# Patient Record
Sex: Male | Born: 1940 | Race: White | Hispanic: No | Marital: Married | Smoking: Never smoker
Health system: Southern US, Community
[De-identification: ages and names within clinical notes are randomized; demographics above are authoritative.]

## PROBLEM LIST (undated history)

## (undated) DIAGNOSIS — I872 Venous insufficiency (chronic) (peripheral): Secondary | ICD-10-CM

## (undated) DIAGNOSIS — I493 Ventricular premature depolarization: Secondary | ICD-10-CM

## (undated) DIAGNOSIS — G4733 Obstructive sleep apnea (adult) (pediatric): Secondary | ICD-10-CM

## (undated) DIAGNOSIS — I459 Conduction disorder, unspecified: Secondary | ICD-10-CM

## (undated) DIAGNOSIS — I10 Essential (primary) hypertension: Secondary | ICD-10-CM

## (undated) HISTORY — DX: Obstructive sleep apnea (adult) (pediatric): G47.33

## (undated) HISTORY — DX: Conduction disorder, unspecified: I45.9

## (undated) HISTORY — DX: Ventricular premature depolarization: I49.3

## (undated) HISTORY — DX: Venous insufficiency (chronic) (peripheral): I87.2

## (undated) HISTORY — DX: Essential (primary) hypertension: I10

---

## 2003-07-20 ENCOUNTER — Ambulatory Visit (HOSPITAL_COMMUNITY): Admission: RE | Admit: 2003-07-20 | Discharge: 2003-07-20 | Payer: Self-pay | Admitting: Gastroenterology

## 2003-07-20 ENCOUNTER — Encounter (INDEPENDENT_AMBULATORY_CARE_PROVIDER_SITE_OTHER): Payer: Self-pay | Admitting: Specialist

## 2004-09-08 ENCOUNTER — Encounter: Admission: RE | Admit: 2004-09-08 | Discharge: 2004-09-08 | Payer: Self-pay

## 2004-09-12 ENCOUNTER — Ambulatory Visit (HOSPITAL_COMMUNITY): Admission: RE | Admit: 2004-09-12 | Discharge: 2004-09-12 | Payer: Self-pay

## 2004-09-12 ENCOUNTER — Ambulatory Visit (HOSPITAL_BASED_OUTPATIENT_CLINIC_OR_DEPARTMENT_OTHER): Admission: RE | Admit: 2004-09-12 | Discharge: 2004-09-12 | Payer: Self-pay

## 2006-06-10 ENCOUNTER — Ambulatory Visit: Payer: Self-pay | Admitting: Internal Medicine

## 2006-06-24 ENCOUNTER — Ambulatory Visit: Payer: Self-pay

## 2006-06-24 ENCOUNTER — Ambulatory Visit: Payer: Self-pay | Admitting: Internal Medicine

## 2006-06-24 ENCOUNTER — Encounter: Payer: Self-pay | Admitting: Cardiology

## 2007-04-12 ENCOUNTER — Ambulatory Visit (HOSPITAL_BASED_OUTPATIENT_CLINIC_OR_DEPARTMENT_OTHER): Admission: RE | Admit: 2007-04-12 | Discharge: 2007-04-12 | Payer: Self-pay | Admitting: Orthopedic Surgery

## 2010-07-01 NOTE — Op Note (Signed)
Ralph Goodman, Ralph Goodman             ACCOUNT NO.:  [id number]   MEDICAL RECORD NO.:  [id number]          PATIENT TYPE:  AMB   LOCATION:  [facility]                         FACILITY:  [facility]   PHYSICIAN:  Deidre Ala, M.D.    DATE OF BIRTH:  18-May-1940   DATE OF PROCEDURE:  04/12/2007  DATE OF DISCHARGE:                               OPERATIVE REPORT   PREOPERATIVE DIAGNOSES:  1. 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  2. Second hammertoe with dorsal callosity and marked fixed proximal      interphalangeal joint flexion deformity.  3. Third mallet toe with varus configuration and fixed distal      deformity.   POSTOPERATIVE DIAGNOSES:  1. 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  2. Second hammertoe with dorsal callosity and marked fixed proximal      interphalangeal joint flexion deformity.  3. Third mallet toe with varus configuration and fixed distal      deformity.   PROCEDURES:  1. Left foot modified McBride bunionectomy with abductor release.  2. Left hammertoe correction with resection of PIP joint and DuVries      correction.  3. Left third mallet toe correction with distal DuVries cuneiform      shape, valgus producing.   SURGEON:  1. Charlesetta Shanks, M.D.   ASSISTANT:  Phineas Semen, P.A.C.   ANESTHESIA:  General with LMA and ankle block.   CULTURES:  None.   DRAINS:  None.   ESTIMATED BLOOD LOSS:  Minimal.   TOURNIQUET TIME:  One hour 12 minutes.   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  He has metatarsus primus  varus.  We discussed first metatarsal osteotomy at the base with  internal fixation, but he felt that that procedure was too extensive and  understood the limitations of simple bunionectomy with adductor release  but elected to go with that as well as the hammertoe and mallet toe  corrections as these were his most symptomatic problems.  Therefore at  surgery, we removed the bony exostosis of the first metatarsal,  tightening the capsule medially and straightened but not over-corrected  the great toe position to a more physiologic position and did an  adductor release.  The second toe, we did a classic DuVries wedge  resection with the apex plantar the PIP joint with resection as per the  DuVries methodology of skin, tendon, joint and release of the flexor  tendon.  This corrected with the Xeroflo bolsters and vertical mattress  suture 4-0 nylon the toe, removing the painful exostosis, the fixed  deformity and leaving the toe in a somewhat physiologically curved in  the anterior-posterior plane  position as per normal toe configuration.  The DIP, there was some question about whether not it would be needed to  be fixed, but I felt, although a bit flexed, it was not fixed and  therefore we left it.  It is slightly curled downward by overall  deformity but I think the toe configuration overall is back to  physiologic.  The third toe had more of a mallet deformity and we were  able to correct it with the same DuVries wedge component distally with a  cuneiform construction with the apex medial and plantar and therefore  corrected the Veress sitting it nicely between the second and third toes  when brought up.  There was some preexisting nail plate deformity  secondary to old nail bed injury or trauma, this was not addressed.  Bolsters were used on that toe.   PROCEDURE:  With adequate anesthesia obtained using LMA technique, 1  gram Ancef given IV prophylaxis, the patient was placed in the  supine  position.  The left lower extremity was prepped from toes to the midcalf  in the standard fashion.  After standard prepping and draping, Esmarch  examination was used, the tourniquet was let up to 350 mmHg.  I then  made a curvilinear incision over the dorsal medial aspect of the MTP  joint of the great toe.  Incision was deepened sharply with the knife  and hemostasis obtained using the Bovie electrocoagulator.  There were  numerous coursing veins that were carefully protected and retracted.  A  capsulotomy was then made with the apex based distal.  We then excised  some of the soft tissue on the exostosis and reflected the capsule back  distally.  I then placed retractors and with an oscillating saw, removed  the exostosis.  I then beveled the superior medial edge as well as took  the osteophyte off the distal MTP joint metatarsal head.  Irrigation was  carried out.  I then made an incision in the web between one and two and  dissected down to the abductor which I released with a 64 Beaver blade.  I then closed the capsule with the toe corrected, but not over-corrected  with figure-of-eight 2-0 Vicryl sutures with a good capsular soft tissue  closure effected.  These wounds were then closed with running 4-0 nylon  with interrupted vertical mattress sutures.  Attention was then turned  to the left second toe where the dorsal wedge resection of skin and the  exostosis and the colostomy was carried out on the PIP joint extensor  tendon.  The saw was used for the joint surface itself with the wedge  resection and I then released the flexor tendon.  I then used the  bolsters with a vertical mattress suture of 4-0 nylon and corrected the  angular downward deformity using the bolsters as fulcrums.  I then  sutured both sides of the wound with 4-0 nylon.  Attention was then  turned to the left third mallet toe where the exact same procedure was  performed on the DIP joint but with a  cuneiform configuration correcting  the varus to valgus as it extended the DIP joint.  Bolster was then  used.  Bulky sterile compressive forefoot dressing was applied with Ace.  The patient then, having the procedure well, was taken to the recovery  room in satisfactory condition, to be discharged per outpatient routine,  crutches weightbearing as tolerated on his heel with a wooden sole  fracture  shoe.  Told to call the office for appointment for recheck on  Friday.  Given Tylenol No. 3 for pain.           ______________________________  V. Charlesetta Shanks, M.D.     VEP/MEDQ  D:  04/12/2007  T:  04/13/2007  Job:  [id number]

## 2010-07-01 NOTE — Assessment & Plan Note (Signed)
[facility]                         ELECTROPHYSIOLOGY OFFICE NOTE   NAME:Ralph Goodman, Lemming                    MRN:          [id number]  DATE:06/10/2006                            DOB:          [birth year]    Dr. Sharol Harness is referred today by Dr. Pervis Hocking for evaluation of  peripheral edema and irregular heartbeat.   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  He has a  history of sleep apnea, which was recently diagnosed, he has a history  of obesity, which is mild, he has a history of asthma, he has a history  of hypertension and dyslipidemia.  He initially to medical attention  after spending a shift where he was up all night taking care of a  critically ill child, and noted that he had severe peripheral edema  afterwards.  His peripheral edema ultimately went away, but since then  he has had occasional episodes where again after being up on his feet  for more often than usual, he develops peripheral edema at the end of  the day, typically getting better at the end of the day.  He has also  noted that at times his pulse is irregular though he has had no recent  EKG.   PAST MEDICAL HISTORY:  1. Hypertension, which has been present for 8 to 10 years.  2. History of dyslipidemia at least since 2004; he is on Lipitor.  3. History of peripheral edema for the last week intermittently.   FAMILY HISTORY:  His father died of complications of an MI in his early  58s; his mother died in her mid 30s of complications of cerebrovascular  disease.  He has two sisters, who are otherwise well except for some  thyroid dysfunction.   MEDICATIONS:  1. Avapro, Lipitor, Nexium, Singulair, aspirin, p.r.n. Albuterol      inhalers, and Advair.  2. Amlodipine 5 a day.  3. HCTZ 25 a day.  4. Acyclovir 400 p.r.n.   SOCIAL HISTORY:  The patient denies tobacco or ethanol abuse.   He does  drink occasional alcoholic beverages, but he denies in excess.  He  drinks up to three caffeinated beverages per day.  He has no  recreational drug use.  He exercises regularly, typically walking  several hours per week.   REVIEW OF SYSTEMS:  Review of systems are as noted in the HPI.  He also  has a history of a severe coughing spell several months ago where he  actually fractured some ribs.  He has a history of wheezing occasionally  and allergic rhinitis.  The rest of his review of systems was negative.   PAST SURGICAL HISTORY:  1. Tendon repair of the left finger.  2. Hernia repair.   PHYSICAL EXAMINATION:  GENERAL:  He is a pleasant, well-appearing,  middle-aged man in no distress.  VITAL SIGNS:  The blood pressure was 140/85, the pulse was 76 and  regular, the respirations were 18, temperature was 98.  HEENT:  Normocephalic and atraumatic, pupils were equal and  round, the  oropharynx was moist, the sclerae were anicteric.  NECK:  No jugulovenous distention, there was no thyromegaly, I did not  appreciate any bruits, the trachea was midline.  LUNGS:  Clear bilaterally to auscultation; no wheezes, rales, or  rhonchi.  CARDIOVASCULAR:  Irregular rhythm with normal S1 and a split S2, which  was physiologic.  The PMI was not enlarged nor laterally displaced.  ABDOMEN:  Obese, nontender, and nondistended, there was no organomegaly,  the bowel sounds were present, and there was no rebound or guarding.  EXTREMITIES:  No cyanosis or clubbing, trace peripheral edema  bilaterally.  Pulses were 2+ and symmetric.  NEUROLOGIC:  Alert and oriented x3 with cranial nerves intact, the  strength was 5/5 and symmetric.   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   IMPRESSION:  1. Peripheral edema.  2. Irregular heartbeat without palpitations.  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  4. Recent diagnosis of  sleep apnea undergoing evaluation for      continuous positive airway pressure.   DISCUSSION:  The patient's irregular heartbeat may well just be PACs; he  clearly has PACs on his EKG.  He could also have A-fib and for this  reason I have recommended a three-week monitor.  For his peripheral  edema and his recent diagnosis of sleep apnea, I have recommended that  he undergo three-week cardiac monitoring to rule out atrial  fibrillation, which may be occult.  For now, I have not made any changes  in his medications though with his peripheral edema we could consider  changing his Amlodipine to something else though his peripheral edema  has not bothered him particularly and so I would be reluctant to do this  on my own, although I would seek a discussion with his primary  physician, Dr. Pervis Hocking over in [place].  We will plan to see  the patient back in approximately five to six weeks.     Doylene Canning. Ladona Ridgel, MD  Electronically Signed   GWT/MedQ  DD: 06/10/2006  DT: 06/10/2006  Job #: [id number]   cc:   Graciella Belton, M.D.

## 2010-07-04 NOTE — Op Note (Signed)
NAMELASHAN, GLUTH             ACCOUNT NO.:  [id number]   MEDICAL RECORD NO.:  [id number]          PATIENT TYPE:  AMB   LOCATION:  [facility]                         FACILITY:  [facility]   PHYSICIAN:  Lorre Munroe., M.D.DATE OF BIRTH:  09/26/1940   DATE OF PROCEDURE:  09/12/2004  DATE OF DISCHARGE:                                 OPERATIVE REPORT   PREOPERATIVE DIAGNOSIS:  Umbilical hernia.   POSTOPERATIVE DIAGNOSIS:  Umbilical hernia.   OPERATION:  Repair of umbilical hernia.   SURGEON:  Lebron Conners, M.D.   ANESTHESIA:  General and local.   COMPLICATIONS:  None.   BLOOD LOSS:  Minimal.   SPECIMENS:  None.   DESCRIPTION OF PROCEDURE:  After the patient was monitored and anesthetized  and had routine preparation and draping of the abdomen,  I infiltrated local  anesthetic around the umbilicus where the umbilical hernia was present and  easily palpable. I made about a 3 cm transverse downwardly curved incision  just above the umbilicus and dissected down through the fat separating the  hernia from the surrounding normal subcutaneous tissues. The hernia was very  tightly adherent to the umbilical skin. I used the cautery and the scalpel  to dissect it off the umbilical skin. In so doing, I made a small hole in  the umbilical skin and repaired that with intracuticular 4-0 Vicryl suture.  I dissected the hernia sac down to the fascia and loosened its connections  at the fascia and reduced it beneath. It appeared to contain only  preperitoneal fat. I then dissected the preperitoneal fat away from the  umbilical hernia defect for several centimeters on each side. After getting  good hemostasis, I placed a small patch of polypropylene mesh beneath the  fascia and spread it out in the plane just beneath the fascia. I mobilized  the soft tissues off the fascia superficially for a short distance in all  directions and then repaired the hernia defect with five sutures of 2-0  Prolene  suture attaching the mesh to the sutures to provide a stable repair.  No other hernia defects were found. I then  tacked the umbilical skin down to the midline with 3-0 Vicryl and closed the  subcutaneous space somewhat with 3-0 Vicryl to prevent seroma. I closed the  skin with running intracuticular 4-0 Vicryl and Steri-Strips and applied a  compressive bandage.       WB/MEDQ  D:  09/12/2004  T:  09/12/2004  Job:  [id number]   cc:   Jillene Bucks, M.D.  Dept. of Medicine, [facility]  [address]  [place], [place] [address]

## 2010-07-04 NOTE — Op Note (Signed)
NAMEDONEL, OSOWSKI                         ACCOUNT NO.:  [id number]   MEDICAL RECORD NO.:  [id number]                   PATIENT TYPE:  AMB   LOCATION:  ENDO                                 FACILITY:  [facility]   PHYSICIAN:  Bernette Redbird, M.D.                DATE OF BIRTH:  [birth year]   DATE OF PROCEDURE:  07/20/2003  DATE OF DISCHARGE:                                 OPERATIVE REPORT   PROCEDURE:  Colonoscopy with biopsy.   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 He comes in now for colonoscopic  surveillance.   FINDINGS:  Two diminutive polyps removed.   DESCRIPTION OF PROCEDURE:  The risk of the procedure had been reviewed with  the patient who provided written consent.  Sedation was fentanyl 75 mcg and  Versed 7 mg IV without arrhythmias or desaturation. Digital exam of the  prostate was normal.  The Olympus adult video colonoscope was extremely  easily advanced to the cecum and into a normal appearing terminal ileum and  pullback was then performed. There was a fair amount of stool film in the  proximal ascending colon and cecum, but this could be washed off quite  effectively. We irrigated about 1500 mL to achieve this.  It is felt that  essentially all areas were well seen and that no significant lesions would  have been missed on this exam.   There were two small sessile polyps, each measuring about 2-4 mm across,  removed by several cold biopsies each. The first was just above the cecum,  the other at about 30 cm. No large polyps, cancer, colitis, vascular  malformations or diverticulosis were observed and retroflexion in the rectum  and resinspection of the rectum were unremarkable.   The patient tolerated the procedure well and there were no apparent  complications.   IMPRESSION:  Small sessile polyps removed as described above (211.3).   PLAN:  Await pathology results with anticipated colonoscopic surveillance in  3-5 years depending on the current histologic findings.                                               Bernette Redbird, M.D.    RB/MEDQ  D:  07/20/2003  T:  07/20/2003  Job:  [id number]   cc:   Dr. Leta Baptist  Divsion of General Internal Medicine  [facility]  [place], [place]

## 2010-11-07 LAB — I-STAT 8, (EC8 V) (CONVERTED LAB)
Acid-Base Excess: 4 — ABNORMAL HIGH
Chloride: 101
Glucose, Bld: 102 — ABNORMAL HIGH
Operator id: 114531
Potassium: 3.8
Sodium: 136
TCO2: 30
pH, Ven: 7.434 — ABNORMAL HIGH

## 2011-07-23 ENCOUNTER — Other Ambulatory Visit: Payer: Self-pay | Admitting: Gastroenterology

## 2011-12-10 ENCOUNTER — Other Ambulatory Visit: Payer: Self-pay | Admitting: Dermatology

## 2014-11-15 ENCOUNTER — Other Ambulatory Visit (HOSPITAL_COMMUNITY): Payer: Self-pay | Admitting: Orthopedic Surgery

## 2014-11-15 DIAGNOSIS — M25552 Pain in left hip: Secondary | ICD-10-CM

## 2014-11-16 ENCOUNTER — Other Ambulatory Visit (HOSPITAL_COMMUNITY): Payer: Self-pay | Admitting: Orthopedic Surgery

## 2014-11-16 DIAGNOSIS — M25552 Pain in left hip: Secondary | ICD-10-CM

## 2014-11-16 DIAGNOSIS — M545 Low back pain: Secondary | ICD-10-CM

## 2014-11-23 ENCOUNTER — Other Ambulatory Visit (HOSPITAL_COMMUNITY): Payer: Self-pay | Admitting: Orthopedic Surgery

## 2014-11-23 ENCOUNTER — Ambulatory Visit (HOSPITAL_COMMUNITY)
Admission: RE | Admit: 2014-11-23 | Discharge: 2014-11-23 | Disposition: A | Discharge status: Home / Self Care | Payer: Medicare Other | Source: Ambulatory Visit | Attending: Orthopedic Surgery | Admitting: Orthopedic Surgery

## 2014-11-23 DIAGNOSIS — M8938 Hypertrophy of bone, other site: Secondary | ICD-10-CM | POA: Insufficient documentation

## 2014-11-23 DIAGNOSIS — M545 Low back pain: Secondary | ICD-10-CM | POA: Diagnosis present

## 2014-11-23 DIAGNOSIS — M549 Dorsalgia, unspecified: Secondary | ICD-10-CM

## 2014-11-23 DIAGNOSIS — M5137 Other intervertebral disc degeneration, lumbosacral region: Secondary | ICD-10-CM | POA: Diagnosis not present

## 2014-11-23 DIAGNOSIS — M25552 Pain in left hip: Secondary | ICD-10-CM

## 2014-12-17 ENCOUNTER — Other Ambulatory Visit: Payer: Self-pay | Admitting: Gastroenterology

## 2016-06-05 ENCOUNTER — Telehealth: Payer: Self-pay | Admitting: Internal Medicine

## 2016-06-05 DIAGNOSIS — G4733 Obstructive sleep apnea (adult) (pediatric): Secondary | ICD-10-CM

## 2016-06-05 NOTE — Telephone Encounter (Signed)
Spoke with pt and he is aware of order being placed for the new replacement cpap machine.

## 2016-06-05 NOTE — Telephone Encounter (Signed)
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 He found responsivness from [facility] to be slow. He has been compliant user of CPAP 13, nasal pillows. Machine is now old and needs replacement. Original NPSG 04/18/06- AHI 30.9/ hr at [facility]  desat to 79%, body weight 225 lbs There has been no break in therapy.   Plan- order new DME, replacement for old CPAP machine, changing to auto 10-15, mask of choice, humidifier, supplies, AirView  Dx OSA.

## 2016-07-01 ENCOUNTER — Telehealth: Payer: Self-pay | Admitting: Internal Medicine

## 2016-07-01 NOTE — Telephone Encounter (Signed)
KW please advise when pt can be worked in. Thanks. 

## 2016-07-01 NOTE — Telephone Encounter (Signed)
Pt calling to check status of cpap machine/supplies ordered for him on 06/05/2016.  Per referral notes: General 06/25/2016 3:32 PM Darius BumpBowne, Theresa C - -  Note   Pt will need an appt before this can be done we have sent it back to Katie to make an appt with [facility]. I will cancel this order till they are seen and make a new one.         CY please advise where patient can be worked in to schedule- we cannot order supplies for pt until he establishes care with our clinic.  Thanks!

## 2016-07-01 NOTE — Telephone Encounter (Signed)
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 Katie- please expedite a work-in visit.

## 2016-07-03 NOTE — Telephone Encounter (Signed)
Called and spoke with pt and he stated that he will be out of town at that time.  He will need katie to call him directly so he can schedule his appt.

## 2016-07-03 NOTE — Telephone Encounter (Signed)
Please have patient come in on 07-14-16 at 11:30am (okay to double book ) for consult. This is the soonest appt we can get for him. Thanks.

## 2016-07-06 NOTE — Telephone Encounter (Signed)
Spoke with Dr. Fransico MichaelSimmons-he will be here tomorrow Tuesday 07/07/16 at 11:30am. Nothing more needed at this time.

## 2016-07-07 ENCOUNTER — Ambulatory Visit (INDEPENDENT_AMBULATORY_CARE_PROVIDER_SITE_OTHER): Payer: Medicare Other | Admitting: Internal Medicine

## 2016-07-07 ENCOUNTER — Encounter: Payer: Self-pay | Admitting: Internal Medicine

## 2016-07-07 VITALS — BP 118/72 | HR 71 | Ht 71.0 in | Wt 260.0 lb

## 2016-07-07 DIAGNOSIS — I4510 Unspecified right bundle-branch block: Secondary | ICD-10-CM | POA: Insufficient documentation

## 2016-07-07 DIAGNOSIS — J449 Chronic obstructive pulmonary disease, unspecified: Secondary | ICD-10-CM

## 2016-07-07 DIAGNOSIS — G4733 Obstructive sleep apnea (adult) (pediatric): Secondary | ICD-10-CM | POA: Insufficient documentation

## 2016-07-07 MED ORDER — FLUTICASONE-UMECLIDIN-VILANT 100-62.5-25 MCG/INH IN AEPB: 1.0000 | INHALATION_SPRAY | Freq: Every day | RESPIRATORY_TRACT | 0 refills | Status: DC

## 2016-07-07 NOTE — Assessment & Plan Note (Signed)
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 [facility]

## 2016-07-07 NOTE — Patient Instructions (Signed)
Order- New DME ([facility]), replacement old CPAP auto 10-15, mask of choice, humidifier, supplies, airView   Dx OSA  Sample Trelegy Ellipta inhaler      Try inhaling 1 puff then rinse mouth, once daily   Try this instead of Advair. If you like it maybe your doctor would be willing to let you stick with it. Otherwise, just go back to Advair. Ok to continue your rescue inhaler as before.  Please call if we can help

## 2016-07-07 NOTE — Assessment & Plan Note (Signed)
He has been a compliant long-term user of CPAP but just needs help getting established with a local DME to replace old machine and supplies. We will take the opportunity to change him to the self adjusting machine-auto 10-15

## 2016-07-07 NOTE — Assessment & Plan Note (Signed)
He has gained about 12 pounds recently and understands this is important. His health and quality of life will significantly improve if he can sustain weight loss.

## 2016-07-07 NOTE — Progress Notes (Signed)
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 [facility]. He has been compliant user of CPAP 13, nasal pillows. Machine is now old and needs replacement. Original NPSG 04/18/06- [facility]/ [facility]  desat to 79%, body weight 225 lbs There has been no break in therapy. Medical problems include HBP, knee pain, Right bundle branch block, peripheral edema, asthma, seasonal allergic rhinitis History of childhood asthma, now mainly associated with cold weather, viral infections. Prednisone very helpful if needed. [facility] He is aware of stable right lower lobe atelectasis with air trapping after a pneumonia. He sleeps very much better with CPAP, which prevents loud snoring and witnessed apneas. He wants to get established with a local DME company. Currently CPAP 13 with nasal pillows mask. He denies limb movement sleep disturbance or other parasomnias.  Prior to Admission medications   Medication Sig Start Date End Date Taking? Authorizing Provider  aspirin EC 81 MG tablet Take 81 mg by mouth daily. 05/01/11  Yes [provider]  eplerenone (INSPRA) 25 MG tablet Take 25 mg by mouth 2 (two) times daily. 12/04/15  Yes [provider]  montelukast (SINGULAIR) 10 MG tablet Take 10 mg by mouth daily. 08/26/13  Yes [provider]  pantoprazole (PROTONIX) 40 MG tablet Take 40 mg by mouth 2 (two) times daily. 04/14/16  Yes [provider]  acyclovir (ZOVIRAX) 400 MG tablet Take 400 mg by mouth daily. 05/21/16   [provider]  ADVAIR DISKUS 250-50 MCG/DOSE AEPB Inhale 1 puff into the lungs 2 (two) times daily. 05/22/16   [provider]  amLODipine (NORVASC) 5 MG tablet Take 5 mg by mouth daily. 04/20/16   [provider]  atorvastatin (LIPITOR) 10 MG tablet  Take 10 mg by mouth daily. 05/17/16   [provider]  celecoxib (CELEBREX) 100 MG capsule Take 100 mg by mouth 2 (two) times daily. 05/28/16   [provider]  Cholecalciferol (VITAMIN D-1000 MAX ST) 1000 units tablet Take 25 mg by mouth daily.    [provider]  Fluticasone-Umeclidin-Vilant (TRELEGY ELLIPTA) 100-62.5-25 MCG/INH AEPB Inhale 1 puff into the lungs daily. 07/07/16   Jetty DuhamelYoung, Vilma Will D, MD  irbesartan (AVAPRO) 150 MG tablet Take 1 tablet by mouth 2 (two) times daily. 05/27/16   [provider]   No past medical history on file. No past surgical history on file. No family history on file. Social History   Social History  . Marital status: Married    Spouse name: N/A  . Number of children: N/A  . Years of education: N/A   Occupational History  . Not on file.   Social History Main Topics  . Smoking status: Not on file  . Smokeless tobacco: Not on file  . Alcohol use Not on file  . Drug use: Unknown  . Sexual activity: Not on file   Other Topics Concern  . Not on file   Social History Narrative  . No narrative on file   ROS-see HPI   Negative unless "+" Constitutional:    weight loss, night sweats, fevers, chills, fatigue, lassitude. HEENT:    headaches, difficulty swallowing, tooth/dental problems, sore throat,       sneezing, itching, ear ache, nasal congestion, post nasal drip, snoring CV:    chest pain, orthopnea, PND, swelling  in lower extremities, anasarca,                                                         dizziness, + palpitations Resp:   + shortness of breath with exertion or at rest.                + productive cough,   non-productive cough, coughing up of blood.              change in color of mucus.  wheezing.   Skin:    rash or lesions. GI:  No-   heartburn, indigestion, abdominal pain, nausea, vomiting, diarrhea,                 change in bowel habits, loss of appetite GU: dysuria, change in color of urine, no  urgency or frequency.   flank pain. MS:   joint pain, stiffness, decreased range of motion, back pain. Neuro-     nothing unusual Psych:  change in mood or affect.  depression or anxiety.   memory loss.  OBJ- Physical Exam General- Alert, Oriented, Affect-appropriate, Distress- none acute, + morbidly obese Skin- rash-none, lesions- none, excoriation- none Lymphadenopathy- none Head- atraumatic            Eyes- Gross vision intact, PERRLA, conjunctivae and secretions clear            Ears- Hearing, canals-normal            Nose- Clear, no-Septal dev, mucus, polyps, erosion, perforation             Throat- Mallampati IV , mucosa clear , drainage- none, tonsils- atrophic Neck- flexible , trachea midline, no stridor , thyroid nl, carotid no bruit Chest - symmetrical excursion , unlabored           Heart/CV- RRR , no murmur , no gallop  , no rub, nl s1 s2                           - JVD- none , edema- none, stasis changes- none, varices- none           Lung- clear to P&A, wheeze- none, cough- none , dullness-none, rub- none           Chest wall-  Abd-  Br/ Gen/ Rectal- Not done, not indicated Extrem- cyanosis- none, clubbing, none, atrophy- none, strength- nl Neuro- grossly intact to observation

## 2016-07-07 NOTE — Assessment & Plan Note (Signed)
Moderate persistent. He is usually adequately controlled but admits frequent breakthrough cough. I suggested we try samples of Trelegy Ellipta since I have them available. If he likes this better than his current regimen, he will discuss with his PCP at upcoming visit.

## 2016-10-08 ENCOUNTER — Ambulatory Visit (INDEPENDENT_AMBULATORY_CARE_PROVIDER_SITE_OTHER): Payer: Medicare Other | Admitting: Internal Medicine

## 2016-10-08 ENCOUNTER — Encounter: Payer: Self-pay | Admitting: Internal Medicine

## 2016-10-08 DIAGNOSIS — J449 Chronic obstructive pulmonary disease, unspecified: Secondary | ICD-10-CM

## 2016-10-08 DIAGNOSIS — Z23 Encounter for immunization: Secondary | ICD-10-CM | POA: Diagnosis not present

## 2016-10-08 DIAGNOSIS — G4733 Obstructive sleep apnea (adult) (pediatric): Secondary | ICD-10-CM

## 2016-10-08 NOTE — Progress Notes (Signed)
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 He found responsivness from [facility] to be slow. He has been compliant user of CPAP 13, nasal pillows. Machine is now old and needs replacement. Original NPSG 04/18/06- AHI 30.9/ hr at [facility]  desat to 79%, body weight 225 lbs There has been no break in therapy. Medical problems include HBP, knee pain, Right bundle branch block, peripheral edema, asthma, seasonal allergic rhinitis History of childhood asthma, now mainly associated with cold weather, viral infections. Prednisone very helpful if needed. This problem is managed at [facility]. He is aware of stable right lower lobe atelectasis with air trapping after a pneumonia. He sleeps very much better with CPAP, which prevents loud snoring and witnessed apneas. He wants to get established with a local DME company. Currently CPAP 13 with nasal pillows mask. He denies limb movement sleep disturbance or other parasomnias.  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 Pt wears CPAP nightly for about 7 hours and pressure of 10-15 works well. No new supplies needed at this time. DL attached.  He is "happy" with CPAP control now and feels he benefits. Describes excellent compliance and control. Tried Trelegy inhaler without seeing any benefit so he resumed Advair.  Uses rescue inhaler infrequently. Not wheezing at night. Seasonal fall cough, mostly dry. Not wheezing and no significant rhinitis.  ROS-see HPI   Negative unless "+" Constitutional:    weight loss, night sweats, fevers, chills, fatigue, lassitude. HEENT:    headaches, difficulty swallowing, tooth/dental problems, sore throat,       sneezing,  itching, ear ache, nasal congestion, post nasal drip, snoring CV:    chest pain, orthopnea, PND, swelling in lower extremities, anasarca,                                                         dizziness, + palpitations Resp:   + shortness of breath with exertion or at rest.                 productive cough,  + non-productive cough, coughing up of blood.              change in color of mucus.  wheezing.   Skin:    rash or lesions. GI:  No-   heartburn, indigestion, abdominal pain, nausea, vomiting, diarrhea,                 change in bowel habits, loss of appetite GU: dysuria, change in color of urine, no urgency or frequency.   flank pain. MS:   joint pain, stiffness, decreased range of motion, back pain. Neuro-     nothing unusual Psych:  change in mood or affect.  depression or anxiety.   memory loss.  OBJ- Physical Exam General- Alert, Oriented, Affect-appropriate, Distress- none acute, + morbidly obese Skin- rash-none, lesions- none, excoriation- none Lymphadenopathy- none Head- atraumatic            Eyes- Gross vision intact, PERRLA, conjunctivae and secretions clear  Ears- Hearing, canals-normal            Nose- Clear, no-Septal dev, mucus, polyps, erosion, perforation             Throat- Mallampati IV , mucosa clear , drainage- none, tonsils- atrophic Neck- flexible , trachea midline, no stridor , thyroid nl, carotid no bruit Chest - symmetrical excursion , unlabored           Heart/CV- RRR , no murmur , no gallop  , no rub, nl s1 s2                           - JVD- none , edema- none, stasis changes- none, varices- none           Lung- clear to P&A, wheeze- none, cough- none , dullness-none, rub- none           Chest wall-  Abd-  Br/ Gen/ Rectal- Not done, not indicated Extrem- cyanosis- none, clubbing, none, atrophy- none, strength- nl Neuro- grossly intact to observation

## 2016-10-08 NOTE — Patient Instructions (Signed)
We can continue CPAP auto 10-15, mask of choice, humidifier,  supplies, AirView  Fine to continue Advair and Proair  Please call if we can help  Flu vax

## 2016-10-21 NOTE — Assessment & Plan Note (Signed)
Trelegy was no advantage. Uncomplicated currently. Plan-continue Advair and Pro air

## 2016-10-21 NOTE — Assessment & Plan Note (Signed)
AutoPap 10-15/APS remains appropriate. He is pleased with his current status and emphatic that he feels it benefits him. Plan-no changes required.

## 2017-03-03 IMAGING — MR MR LUMBAR SPINE W/O CM
4 of 5 series · 19 of 48 positions shown · non-contrast
Comparison: None.

CLINICAL DATA: Low back pain radiating into the left leg to the
level of the knee since [DATE].

EXAM:
MRI LUMBAR SPINE WITHOUT CONTRAST
TECHNIQUE: Multiplanar, multisequence MR imaging of the lumbar spine was
performed. No intravenous contrast was administered.

[Series 3: T2 · sagittal · 4.5mm · 0.55mm/px · 6 of 16 slices shown (1 of 2)]
[im 1/16]
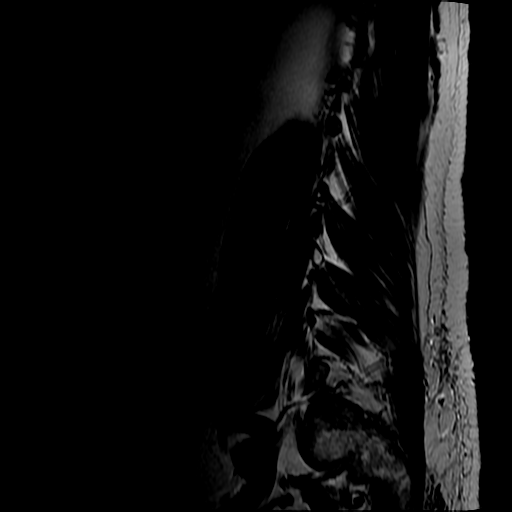
[im 4/16]
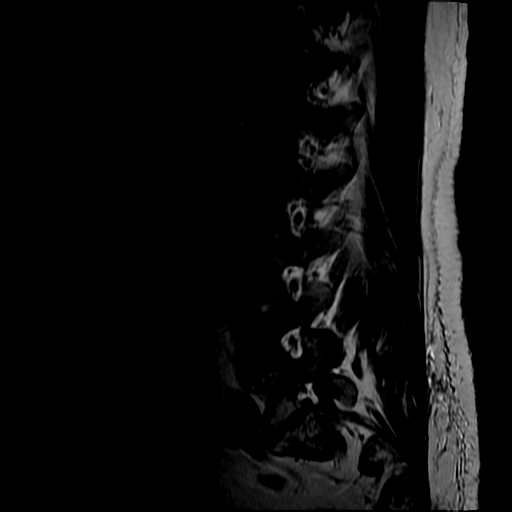
[im 7/16]
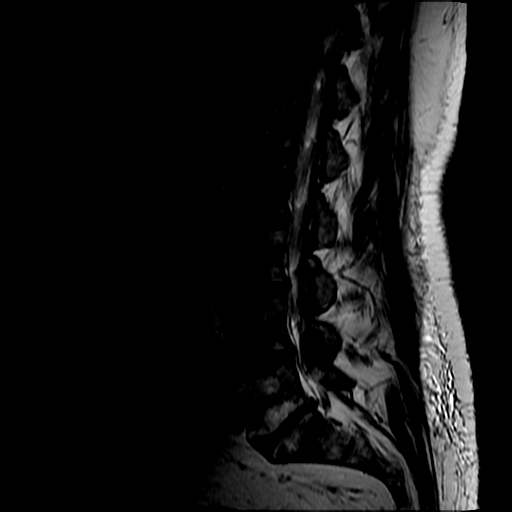
[im 10/16]
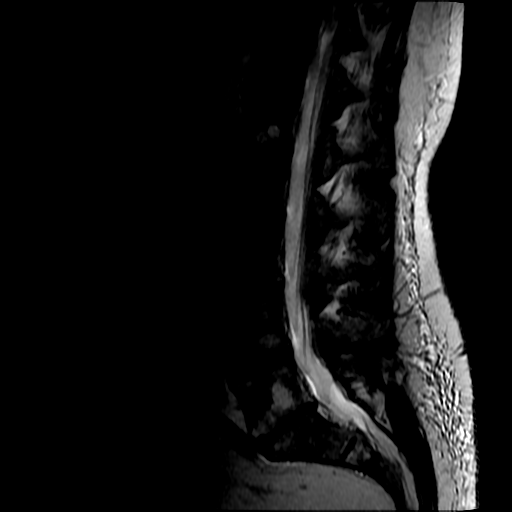
[im 13/16]
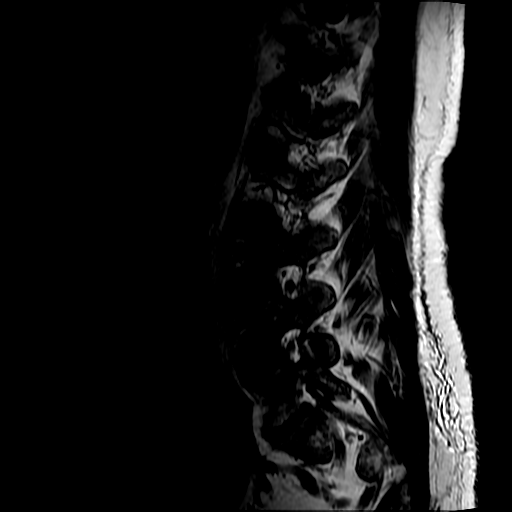
[im 16/16]
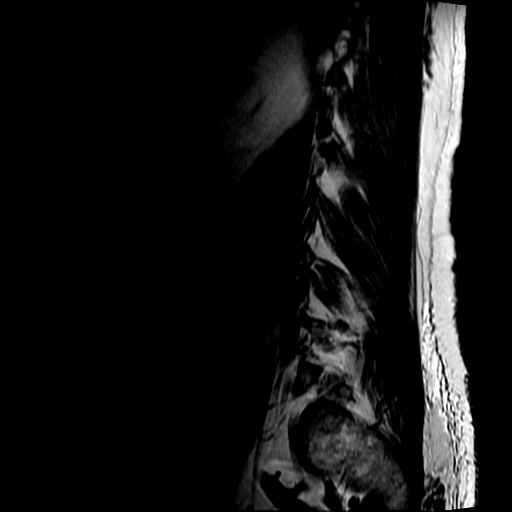

[Series 5: T1 · sagittal · 4.5mm · 0.55mm/px · 3 of 16 slices shown (1 of 2)]
[im 4/16]
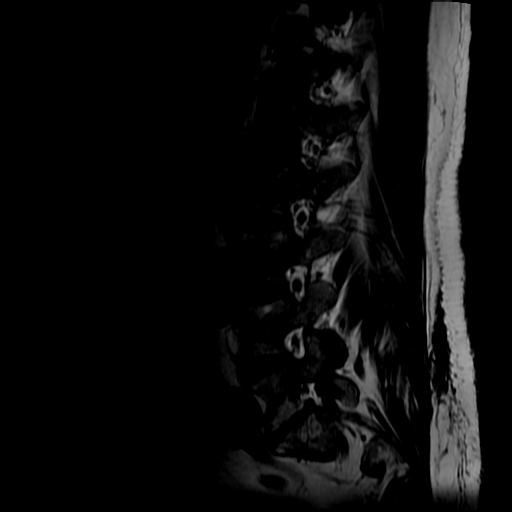
[im 10/16]
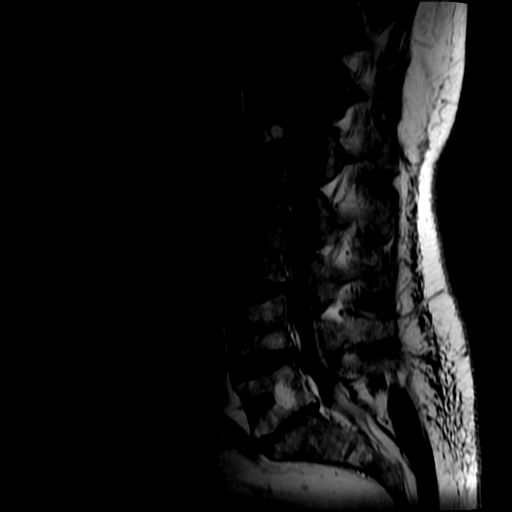
[im 16/16]
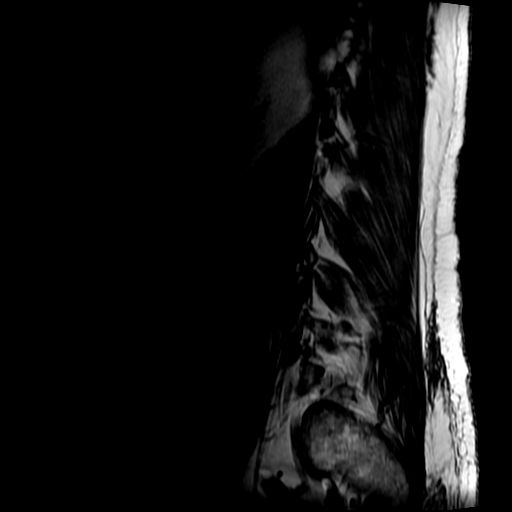

[Series 6: T2 · axial · 4.0mm · 0.39mm/px · z∈[-13,+146]mm · 7 of 37 slices shown (2 of 2)]
[im 1/37]
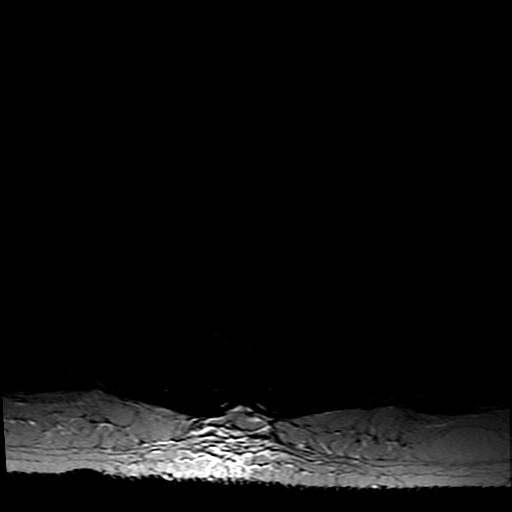
[im 6/37]
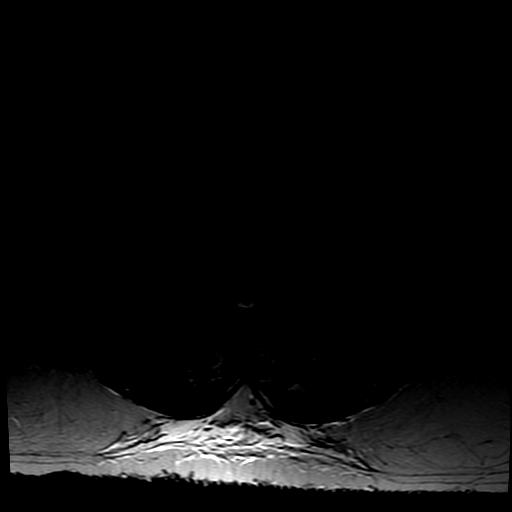
[im 11/37]
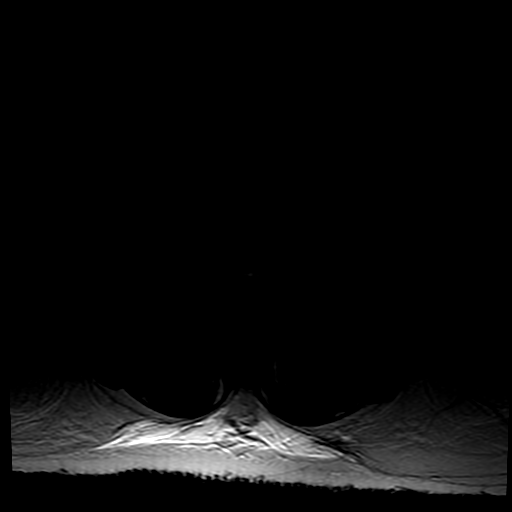
[im 16/37]
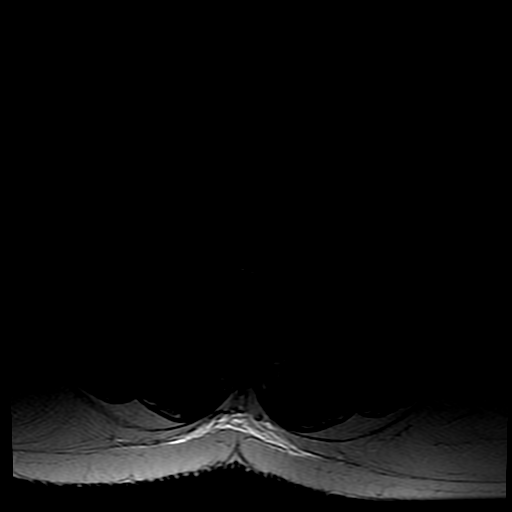
[im 19/37]
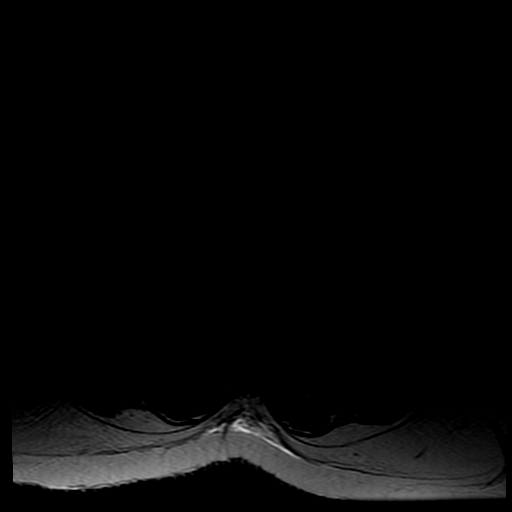
[im 21/37]
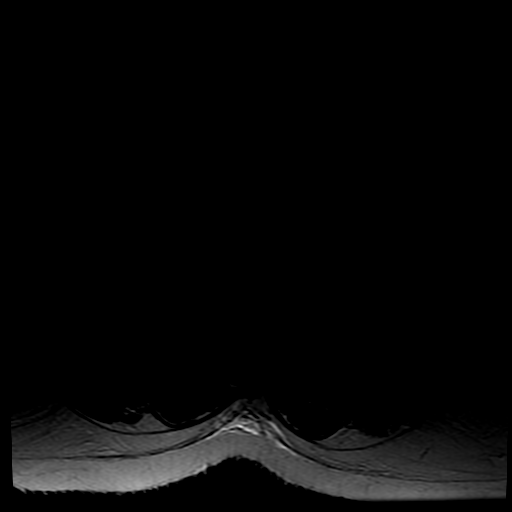
[im 31/37]
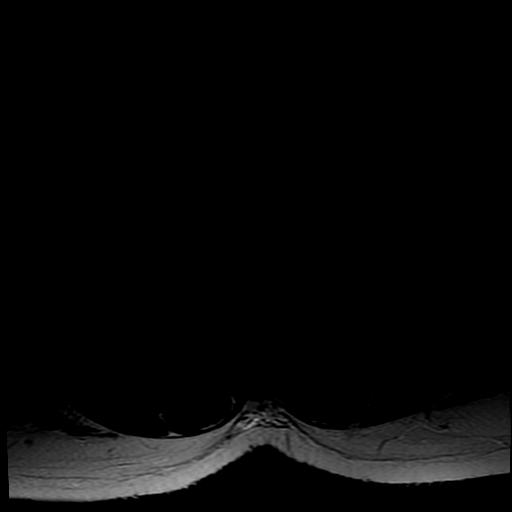

[Series 8: T1 · axial · 4.0mm · 0.39mm/px · z∈[+12,+146]mm · 3 of 37 slices shown (2 of 2)]
[im 6/37]
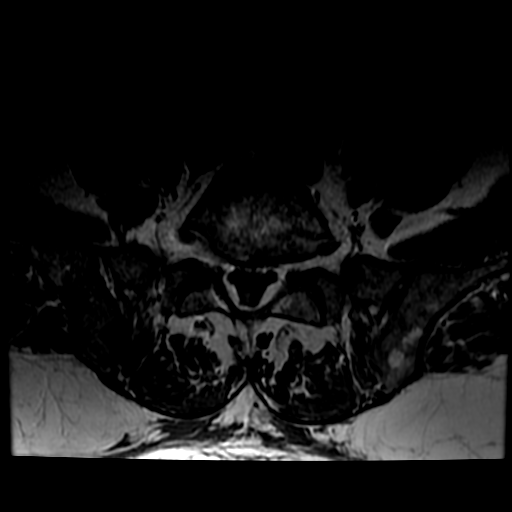
[im 19/37]
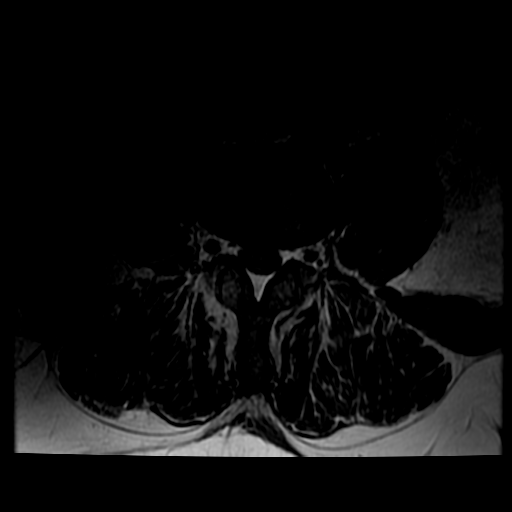
[im 31/37]
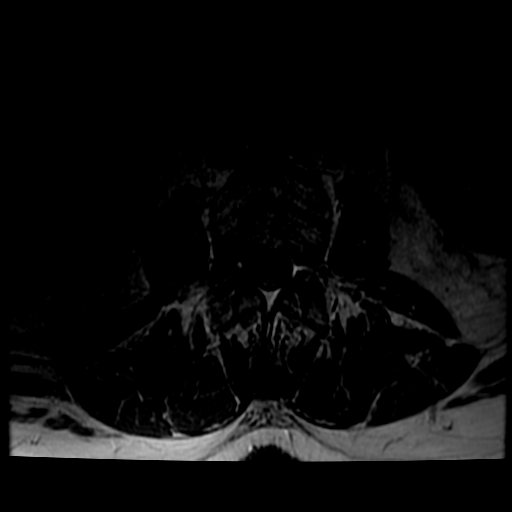

[19 of 48 positions shown; findings below may reference images not displayed]

FINDINGS: Mild motion artifact, mainly on sagittal STIR and axial T1 series.

For the purposes of this dictation, the lowest well-formed
intervertebral disc space is presumed to be L5-S1.

There is trace retrolisthesis of L5 on S1. Vertebral body heights
are preserved. Disc desiccation and moderate to severe disc space
narrowing are present at L5-S1 with evidence of vacuum disc
phenomenon. Moderate type 2 degenerative endplate changes are
present at this level. Intervertebral disc space heights are
preserved elsewhere in the lumbar spine with mild disc desiccation
and mild type 2 degenerative endplate changes noted. No vertebral
marrow edema is seen. Conus medullaris is normal in signal and
terminates at L1-2. Paraspinal soft tissues are unremarkable.

L1-2 and L2-3:  Negative.

L3-4:  Mild facet hypertrophy without disc herniation or stenosis.

L4-5:  Mild facet hypertrophy without disc herniation or stenosis.

L5-S1: Mild disc bulging, endplate spurring, and mild facet
hypertrophy without stenosis.
IMPRESSION: 1. Single level disc degeneration at L5-S1 without stenosis.
2. Mild multilevel facet hypertrophy.

## 2017-04-21 ENCOUNTER — Telehealth: Payer: Self-pay | Admitting: Internal Medicine

## 2017-04-21 NOTE — Telephone Encounter (Signed)
Spoke with the pt  He states since the time of his call to us he was able to contact [employer] for his supplies  They are sending him the supplies and told him that order from us is not necc  Nothing needed per pt.

## 2017-09-14 ENCOUNTER — Telehealth: Payer: Self-pay

## 2017-09-14 NOTE — Telephone Encounter (Signed)
called pt to reschedule upcoming apt for 10/08/17 with CY. First available with CY is not until October. pt stated per medicare, compliance report is needed in August.  I have offered apt with NP and pt declined.   CY please advise. Thanks

## 2017-09-15 ENCOUNTER — Encounter: Payer: Self-pay | Admitting: Internal Medicine

## 2017-09-15 NOTE — Telephone Encounter (Signed)
LMTCB-pt can be seen Friday 09-17-17 at 9:30 or 10:30am. Thanks.

## 2017-09-15 NOTE — Telephone Encounter (Signed)
Katie- please squeeze him in somewhere

## 2017-09-16 NOTE — Telephone Encounter (Signed)
Pt returned phone call. Pt sched to see CY on 8/2 at 9:30.

## 2017-09-17 ENCOUNTER — Encounter: Payer: Self-pay | Admitting: Internal Medicine

## 2017-09-17 ENCOUNTER — Ambulatory Visit: Payer: Medicare Other | Admitting: Internal Medicine

## 2017-09-17 VITALS — BP 130/70 | HR 79 | Ht 70.0 in | Wt 269.6 lb

## 2017-09-17 DIAGNOSIS — G4733 Obstructive sleep apnea (adult) (pediatric): Secondary | ICD-10-CM

## 2017-09-17 DIAGNOSIS — J449 Chronic obstructive pulmonary disease, unspecified: Secondary | ICD-10-CM | POA: Diagnosis not present

## 2017-09-17 MED ORDER — ALBUTEROL SULFATE HFA 108 (90 BASE) MCG/ACT IN AERS: INHALATION_SPRAY | RESPIRATORY_TRACT | 12 refills | Status: DC

## 2017-09-17 NOTE — Assessment & Plan Note (Signed)
He definitely benefits from CPAP with better sleep and is very comfortable with current settings.  Download shows good compliance and control. Plan-continue CPAP auto 10-15

## 2017-09-17 NOTE — Assessment & Plan Note (Signed)
Mild persistent uncomplicated.  Exacerbations mostly if he catches a cold but he did notice increased pine pollen this spring.  Usually well controlled using Advair once daily and albuterol rescue inhaler once daily.

## 2017-09-17 NOTE — Progress Notes (Signed)
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 Pt wears CPAP nightly for about 7 hours and pressure of 10-15 works well. No new supplies needed at this time. DL attached.  He is "happy" with CPAP control now and feels he benefits. Describes excellent compliance and control. Tried Trelegy inhaler without seeing any benefit so he resumed Advair.  Uses rescue inhaler infrequently. Not wheezing at night. Seasonal fall cough, mostly dry. Not wheezing and no significant rhinitis.  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 Pt continues to wear CPAP nightly and DL attached. No new supplies needed at this time.  Advair 250, Singulair, ProAir hfa Doing very well with CPAP.  No complaints.  Download 100% compliance AHI 0.4/hour. Heavy pollen this spring did bother him some.  Currently he is using Advair once daily, preferring over samples we have given.  Rescue inhaler about once daily as routine.  He feels this is controlled.  ROS-see HPI   + = positive Constitutional:    weight loss, night sweats, fevers, chills, fatigue, lassitude. HEENT:    headaches, difficulty swallowing, tooth/dental problems, sore throat,       sneezing, itching, ear ache, nasal congestion, post nasal drip, snoring CV:    chest pain, orthopnea, PND, swelling in lower  extremities, anasarca,                                                         dizziness, + palpitations Resp:   + shortness of breath with exertion or at rest.                 productive cough,  + non-productive cough, coughing up of blood.              change in color of mucus.  wheezing.   Skin:    rash or lesions. GI:  No-   heartburn, indigestion, abdominal pain, nausea, vomiting, diarrhea,                 change in bowel habits, loss of appetite GU: dysuria, change in color of urine, no urgency or frequency.   flank pain. MS:   joint pain, stiffness, decreased range of motion, back pain. Neuro-     nothing unusual Psych:  change in mood or affect.  depression or anxiety.   memory loss.  OBJ- Physical Exam General- Alert, Oriented, Affect-appropriate, Distress- none acute, + morbidly obese Skin- rash-none, lesions- none, excoriation- none Lymphadenopathy- none Head- atraumatic            Eyes- Gross vision intact, PERRLA, conjunctivae and secretions clear            Ears- Hearing, canals-normal            Nose- Clear, no-Septal dev, mucus, polyps, erosion, perforation  Throat- Mallampati IV , mucosa clear , drainage- none, tonsils- atrophic Neck- flexible , trachea midline, no stridor , thyroid nl, carotid no bruit Chest - symmetrical excursion , unlabored           Heart/CV- RRR , no murmur , no gallop  , no rub, nl s1 s2                           - JVD- none , edema- none, stasis changes- none, varices- none           Lung- clear to P&A, wheeze- none, cough- none , dullness-none, rub- none           Chest wall-  Abd-  Br/ Gen/ Rectal- Not done, not indicated Extrem- cyanosis- none, clubbing, none, atrophy- none, strength- nl Neuro- grossly intact to observation

## 2017-09-17 NOTE — Patient Instructions (Signed)
We can continue CPAP auto 10-15, mask of choice, humidifier, supplies, AirView  Please call if we can help 

## 2017-10-08 ENCOUNTER — Ambulatory Visit: Payer: Medicare Other | Admitting: Internal Medicine

## 2017-11-12 ENCOUNTER — Ambulatory Visit (INDEPENDENT_AMBULATORY_CARE_PROVIDER_SITE_OTHER): Payer: Medicare Other

## 2017-11-12 DIAGNOSIS — Z23 Encounter for immunization: Secondary | ICD-10-CM

## 2018-09-19 ENCOUNTER — Ambulatory Visit: Payer: Medicare Other | Admitting: Internal Medicine

## 2018-09-19 ENCOUNTER — Encounter: Payer: Self-pay | Admitting: Internal Medicine

## 2018-09-19 ENCOUNTER — Other Ambulatory Visit: Payer: Self-pay

## 2018-09-19 VITALS — BP 124/60 | HR 79 | Temp 97.9°F | Wt 270.2 lb

## 2018-09-19 DIAGNOSIS — R0609 Other forms of dyspnea: Secondary | ICD-10-CM | POA: Diagnosis not present

## 2018-09-19 DIAGNOSIS — G4733 Obstructive sleep apnea (adult) (pediatric): Secondary | ICD-10-CM | POA: Diagnosis not present

## 2018-09-19 DIAGNOSIS — J449 Chronic obstructive pulmonary disease, unspecified: Secondary | ICD-10-CM | POA: Diagnosis not present

## 2018-09-19 NOTE — Progress Notes (Signed)
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 Pt continues to wear CPAP nightly and DL attached. No new supplies needed at this time.  Advair 250, Singulair, ProAir hfa Doing very well with CPAP.  No complaints.  Download 100% compliance AHI 0.4/hour. Heavy pollen this spring did bother him some.  Currently he is using Advair once daily, preferring over samples we have given.  Rescue inhaler about once daily as routine.  He feels this is controlled.  09/19/2018- 78 year old male, retired Optometristediatrician,  never smoker,  followed for OSA,  complicated by HBP, RBBB, peripheral edema, asthma, seasonal allergic rhinitis, morbid obesity CPAP auto 10-15/APS Download compliance 100%, AHI 0.4/ hr Body weight today 270 lbs -----OSA on CPAP auto 10-15, DME: [facility]; no complaints Feels much better off with CPAP. No concerns. Does note easier DOE. Occ wheeze with exertion in cold.  Using rescue inhaler each AM and occ at night before bed. Continues Wixela 250.  No angina.   ROS-see HPI   + = positive Constitutional:    weight loss, night sweats, fevers, chills, fatigue, lassitude. HEENT:    headaches, difficulty swallowing, tooth/dental problems, sore throat,       sneezing, itching, ear ache, nasal congestion, post nasal drip, snoring CV:    chest pain, orthopnea, PND, swelling in lower extremities, anasarca,                                                         dizziness, + palpitations Resp:   + shortness of breath with  exertion or at rest.                 productive cough,  + non-productive cough, coughing up of blood.              change in color of mucus.  wheezing.   Skin:    rash or lesions. GI:  No-   heartburn, indigestion, abdominal pain, nausea, vomiting, diarrhea,                 change in bowel habits, loss of appetite GU: dysuria, change in color of urine, no urgency or frequency.   flank pain. MS:   joint pain, stiffness, decreased range of motion, back pain. Neuro-     nothing unusual Psych:  change in mood or affect.  depression or anxiety.   memory loss.  OBJ- Physical Exam General- Alert, Oriented, Affect-appropriate, Distress- none acute, + morbidly obese Skin- rash-none, lesions- none, excoriation- none Lymphadenopathy- none Head- atraumatic            Eyes- Gross vision intact, PERRLA, conjunctivae and secretions clear            Ears- Hearing, canals-normal            Nose- Clear, no-Septal dev, mucus, polyps, erosion, perforation             Throat- Mallampati IV , mucosa clear , drainage-  none, tonsils- atrophic Neck- flexible , trachea midline, no stridor , thyroid nl, carotid no bruit Chest - symmetrical excursion , unlabored           Heart/CV- RRR , no murmur , no gallop  , no rub, nl s1 s2                           - JVD- none , edema+, stasis changes+, varices- none           Lung- clear to P&A, wheeze- none, cough- none , dullness-none, rub- none           Chest wall-  Abd-  Br/ Gen/ Rectal- Not done, not indicated Extrem- cyanosis- none, clubbing, none, atrophy- none, strength- nl Neuro- grossly intact to observation

## 2018-09-19 NOTE — Patient Instructions (Signed)
We can continue CPAP auto 10-15, mask of choice, humidifier, supplies, AirView/ card  Order- schedule PFT    Dx dyspnea on exertion

## 2018-11-12 NOTE — Assessment & Plan Note (Signed)
Contnues to benefit from CPAP, confirmed by [facility]. Plan- continue autopap 10-15

## 2018-11-12 NOTE — Assessment & Plan Note (Signed)
More aware of DOE, in addition to baseline of cold and exertion associated wheeze. Obviously obesity, deconditioning are factors. Cardiac status followed by Cardiology with know RBBB. Plan- PFT

## 2018-11-12 NOTE — Assessment & Plan Note (Signed)
Needs encouragement and support toward normalizing body weight he put on after retirement.

## 2018-11-25 ENCOUNTER — Telehealth: Payer: Self-pay | Admitting: Internal Medicine

## 2018-11-28 NOTE — Telephone Encounter (Signed)
Printed & placed in folder for COVID test to be sched for 03/08/2019 PFT.

## 2019-05-05 DIAGNOSIS — J449 Chronic obstructive pulmonary disease, unspecified: Secondary | ICD-10-CM

## 2019-05-05 NOTE — Telephone Encounter (Signed)
Dr. Maple Hudson please advise on time frame for patient to have chest x-ray, PFT and OV with you.  Patient email:  Ralph Goodman -- I saw [name] today for a long delayed by COVID PCP visit.   I told him you had ordered PFTs, and that I had deferred them until I was vaccinated.   He listened to my recent history of more SOB and chronic cough, with modest sputum production.  He said he wanted to get in touch with you, and to have me make an appointment with you after I get PFTs.  He heard lots of wheezing today, and recommends we pursue pulmonary causes of SOB before diving deeper into cardiac.  He did get an ECG, which is unchanged, and ordered a Pro-BNP.    He did not get a chest film, leaving that up to you.  Can you advise how to get PFTs re-scheduled, and then obtain an appointment with you?  Thanks,   [name]

## 2019-05-05 NOTE — Telephone Encounter (Signed)
Please order CXR and PFT next available   Dx Asthmatic Bronchitis  Go ahead with CXR. Get CXR when we can.

## 2019-05-08 ENCOUNTER — Other Ambulatory Visit: Payer: Self-pay

## 2019-05-08 MED ORDER — TRELEGY ELLIPTA 100-62.5-25 MCG/INH IN AEPB: 1.0000 | INHALATION_SPRAY | Freq: Every day | RESPIRATORY_TRACT | 11 refills | Status: DC

## 2019-05-08 NOTE — Addendum Note (Signed)
Addended by: Sheran Luz on: 05/08/2019 12:44 PM   Modules accepted: Orders

## 2019-05-08 NOTE — Addendum Note (Signed)
Addended by: Sheran Luz on: 05/08/2019 05:46 PM   Modules accepted: Orders

## 2019-05-08 NOTE — Telephone Encounter (Signed)
Suggest Rx Trelegy 100, # 1, ref x 12,   Inhale 1 puff, then rinse mouth once daily.

## 2019-05-08 NOTE — Telephone Encounter (Signed)
Pt called and states he is only on the Advair currently but states he feels he needs an anticholinergic. Pt is requesting either to be put back on Trelegy or added another inhaler.   Dr. Maple Hudson please advise.   No Known Allergies  Current Outpatient Medications on File Prior to Visit  Medication Sig Dispense Refill  . acyclovir (ZOVIRAX) 400 MG tablet Take 400 mg by mouth daily.  12  . ADVAIR DISKUS 250-50 MCG/DOSE AEPB Inhale 1 puff into the lungs 2 (two) times daily.  3  . albuterol (PROAIR HFA) 108 (90 Base) MCG/ACT inhaler Inhale 2 puffs every 6 hours as needed- rescue 1 Inhaler 12  . amLODipine (NORVASC) 5 MG tablet Take 5 mg by mouth daily.  12  . atorvastatin (LIPITOR) 10 MG tablet Take 10 mg by mouth daily.  3  . celecoxib (CELEBREX) 100 MG capsule Take 100 mg by mouth 2 (two) times daily.  11  . eplerenone (INSPRA) 25 MG tablet Take 25 mg by mouth 2 (two) times daily.    . irbesartan (AVAPRO) 150 MG tablet Take 1 tablet by mouth 2 (two) times daily.  3  . montelukast (SINGULAIR) 10 MG tablet Take 10 mg by mouth daily.    . pantoprazole (PROTONIX) 40 MG tablet Take 40 mg by mouth 2 (two) times daily.     No current facility-administered medications on file prior to visit.

## 2019-05-10 ENCOUNTER — Ambulatory Visit (INDEPENDENT_AMBULATORY_CARE_PROVIDER_SITE_OTHER): Payer: Medicare PPO

## 2019-05-10 DIAGNOSIS — J449 Chronic obstructive pulmonary disease, unspecified: Secondary | ICD-10-CM | POA: Diagnosis not present

## 2019-05-10 NOTE — Telephone Encounter (Signed)
Email sent by patient today 05/10/19:  Ralph Goodman -- I got my chest film today, and began Trelegy this AM.                  My PFTs are not scheduled until June 15.   Ralph Goodman could get them scheduled earlier at [facility].  Let me know if you want me to do that.  I would like to see you after the PFTs.  Thanks,   Ralph Goodman  Dr. Maple Hudson please advise  No Known Allergies Current Outpatient Medications on File Prior to Visit  Medication Sig Dispense Refill  . acyclovir (ZOVIRAX) 400 MG tablet Take 400 mg by mouth daily.  12  . ADVAIR DISKUS 250-50 MCG/DOSE AEPB Inhale 1 puff into the lungs 2 (two) times daily.  3  . albuterol (PROAIR HFA) 108 (90 Base) MCG/ACT inhaler Inhale 2 puffs every 6 hours as needed- rescue 1 Inhaler 12  . amLODipine (NORVASC) 5 MG tablet Take 5 mg by mouth daily.  12  . atorvastatin (LIPITOR) 10 MG tablet Take 10 mg by mouth daily.  3  . celecoxib (CELEBREX) 100 MG capsule Take 100 mg by mouth 2 (two) times daily.  11  . eplerenone (INSPRA) 25 MG tablet Take 25 mg by mouth 2 (two) times daily.    . Fluticasone-Umeclidin-Vilant (TRELEGY ELLIPTA) 100-62.5-25 MCG/INH AEPB Inhale 1 puff into the lungs daily. 28 each 11  . irbesartan (AVAPRO) 150 MG tablet Take 1 tablet by mouth 2 (two) times daily.  3  . montelukast (SINGULAIR) 10 MG tablet Take 10 mg by mouth daily.    . pantoprazole (PROTONIX) 40 MG tablet Take 40 mg by mouth 2 (two) times daily.     No current facility-administered medications on file prior to visit.

## 2019-05-10 NOTE — Telephone Encounter (Signed)
We are opening up our PFT schedule. I will ask my folks to see if they can get it done earlier than originally scheduled and get you in to see me that day, or soon after. Otherwise, it is absolutely fine to get the PFT done at [facility].

## 2019-05-11 NOTE — Progress Notes (Signed)
Patient identification verified. Recent chest x ray results provided. Per Dr. Maple Hudson, chest x ray is stable with scarring again noted, no change or new process seen. Patient verbalized understanding of results.

## 2019-05-11 NOTE — Telephone Encounter (Signed)
Dr. Maple Hudson, This message came for you this afternoon.  [name] -- Thanks for reviewing the chest film so quickly.  I loved that their comparison film was from 2006.  I have had many films and two CTs since then!  So much for the seamless power of EPIC.                 Should I try to get an earlier appt for PFTs at [facility]?  June 15 seems like a long delay.                              This is day two of Trelegy.  I will let you know if I think there is any difference in the next month.  Thanks,   [employer] routed to Dr. Maple Hudson

## 2019-05-11 NOTE — Telephone Encounter (Signed)
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 If still uncomfortably long delay for hi, then please let him know he can go ahead with scheduling at [facility]. Whatever works best for him please.

## 2019-05-22 ENCOUNTER — Telehealth: Payer: Self-pay

## 2019-05-22 NOTE — Telephone Encounter (Signed)
I spoke with patient's regarding  PFT . Was able to move patient's PFT to April 22. Also scheduled covis screening . Patient's voice was understanding . Nothing else further needed.

## 2019-05-22 NOTE — Telephone Encounter (Signed)
Left a voicemail on patient's phone to call our office back to schedule a PFT. Patient does have a pft for June we have opened up spots for more PFT's.

## 2019-06-05 ENCOUNTER — Other Ambulatory Visit (HOSPITAL_COMMUNITY)
Admission: RE | Admit: 2019-06-05 | Discharge: 2019-06-05 | Disposition: A | Discharge status: Home / Self Care | Payer: Medicare PPO | Source: Ambulatory Visit | Attending: Internal Medicine | Admitting: Internal Medicine

## 2019-06-05 DIAGNOSIS — Z20822 Contact with and (suspected) exposure to covid-19: Secondary | ICD-10-CM | POA: Diagnosis not present

## 2019-06-05 DIAGNOSIS — Z01812 Encounter for preprocedural laboratory examination: Secondary | ICD-10-CM | POA: Diagnosis present

## 2019-06-05 LAB — SARS CORONAVIRUS 2 (TAT 6-24 HRS): SARS Coronavirus 2: NEGATIVE

## 2019-06-08 ENCOUNTER — Ambulatory Visit (INDEPENDENT_AMBULATORY_CARE_PROVIDER_SITE_OTHER): Payer: Medicare PPO

## 2019-06-08 ENCOUNTER — Ambulatory Visit (INDEPENDENT_AMBULATORY_CARE_PROVIDER_SITE_OTHER): Payer: Medicare PPO | Admitting: Internal Medicine

## 2019-06-08 ENCOUNTER — Other Ambulatory Visit: Payer: Self-pay

## 2019-06-08 ENCOUNTER — Telehealth: Payer: Self-pay | Admitting: Internal Medicine

## 2019-06-08 DIAGNOSIS — R0600 Dyspnea, unspecified: Secondary | ICD-10-CM | POA: Diagnosis not present

## 2019-06-08 DIAGNOSIS — J449 Chronic obstructive pulmonary disease, unspecified: Secondary | ICD-10-CM

## 2019-06-08 DIAGNOSIS — R0609 Other forms of dyspnea: Secondary | ICD-10-CM

## 2019-06-08 LAB — PULMONARY FUNCTION TEST
DL/VA % pred: 150 %
DL/VA: 5.85 ml/min/mmHg/L
DLCO cor % pred: 102 %
DLCO cor: 25.2 ml/min/mmHg
DLCO unc % pred: 102 %
DLCO unc: 25.2 ml/min/mmHg
FEF 25-75 Post: 0.98 L/sec
FEF 25-75 Pre: 0.8 L/sec
FEF2575-%Change-Post: 22 %
FEF2575-%Pred-Post: 48 %
FEF2575-%Pred-Pre: 39 %
FEV1-%Change-Post: 6 %
FEV1-%Pred-Post: 54 %
FEV1-%Pred-Pre: 51 %
FEV1-Post: 1.59 L
FEV1-Pre: 1.49 L
FEV1FVC-%Change-Post: 1 %
FEV1FVC-%Pred-Pre: 91 %
FEV6-%Change-Post: 8 %
FEV6-%Pred-Post: 61 %
FEV6-%Pred-Pre: 57 %
FEV6-Post: 2.35 L
FEV6-Pre: 2.18 L
FEV6FVC-%Change-Post: 0 %
FEV6FVC-%Pred-Post: 105 %
FEV6FVC-%Pred-Pre: 106 %
FVC-%Change-Post: 5 %
FVC-%Pred-Post: 58 %
FVC-%Pred-Pre: 55 %
FVC-Post: 2.38 L
FVC-Pre: 2.26 L
Post FEV1/FVC ratio: 67 %
Post FEV6/FVC ratio: 99 %
Pre FEV1/FVC ratio: 66 %
Pre FEV6/FVC Ratio: 99 %

## 2019-06-08 NOTE — Telephone Encounter (Signed)
Yes please order CXR   dx Asthmatic bronchitis moderate persistent

## 2019-06-08 NOTE — Telephone Encounter (Signed)
Spoke with patient. He is coming in for a PFT this afternoon. He wants to have a CXR while he is here. He stated that he was told to have one done today but there is no order in his chart.   Dr. Maple Hudson, please advise if you are ok with ordering a CXR for him. Looks like he last one done back in March. Thank you!

## 2019-06-08 NOTE — Progress Notes (Signed)
PFT done today. 

## 2019-06-08 NOTE — Telephone Encounter (Signed)
Received the following message from patient:   "[name] -- I had my PFTs done this afternoon, and got another chest film.                 I do not do well with PFTs.   The tech was fabulous, and remarkably patient with me .......... and properly persistent.   I hope you got an interpretable study.   If not, it is your patient, not the technology nor the tech.                Trelegy -- Assessing improvement is hard.  This pollen season always affects me.    I think before the pine pollen hit, I was having less cough, but still about the same DOE.                 Should we chat, or should I make an appointment?  Thanks,   Ralph Goodman"  Dr. Maple Hudson, please advise. Thanks!

## 2019-06-08 NOTE — Telephone Encounter (Signed)
Left detailed message for patient. Order has been placed. Will close this encounter.

## 2019-06-08 NOTE — Telephone Encounter (Signed)
Left message for patient to call back  

## 2019-06-09 MED ORDER — BREZTRI AEROSPHERE 160-9-4.8 MCG/ACT IN AERO: 2.0000 | INHALATION_SPRAY | Freq: Two times a day (BID) | RESPIRATORY_TRACT | 0 refills | Status: DC

## 2019-06-09 NOTE — Telephone Encounter (Signed)
Per our phone discussion this afternoon, there is a mixed obstructive and restrictive pattern on PFTs, with preserved oxygen exchange( i.e Diffussion Capacity). Your primary physician may be able to compare this result to earlier PFTs in the [facility] system. Your chest xray shows old scarring, but nothing that looks like a new or progressive lung problem.  I will ask my staff to leave up front for you: 1) A copy of the interpretation of your PFT  2) 2 samples of Breztri inhaler to try instead of Trelegy    Inhale 2 puffs, then rinse mouth, twice daily

## 2019-06-09 NOTE — Addendum Note (Signed)
Addended by: Wyvonne Lenz on: 06/09/2019 03:27 PM   Modules accepted: Orders

## 2019-06-21 MED ORDER — BREZTRI AEROSPHERE 160-9-4.8 MCG/ACT IN AERO: 2.0000 | INHALATION_SPRAY | Freq: Two times a day (BID) | RESPIRATORY_TRACT | 6 refills | Status: DC

## 2019-07-29 ENCOUNTER — Other Ambulatory Visit (HOSPITAL_COMMUNITY): Payer: Medicare Other

## 2019-10-04 ENCOUNTER — Other Ambulatory Visit: Payer: Self-pay

## 2019-10-04 ENCOUNTER — Encounter: Payer: Self-pay | Admitting: Internal Medicine

## 2019-10-04 ENCOUNTER — Ambulatory Visit: Payer: Medicare PPO | Admitting: Internal Medicine

## 2019-10-04 VITALS — BP 136/76 | HR 65 | Temp 97.2°F | Ht 70.0 in | Wt 265.4 lb

## 2019-10-04 DIAGNOSIS — J449 Chronic obstructive pulmonary disease, unspecified: Secondary | ICD-10-CM

## 2019-10-04 DIAGNOSIS — G4733 Obstructive sleep apnea (adult) (pediatric): Secondary | ICD-10-CM

## 2019-10-04 NOTE — Patient Instructions (Signed)
Order- DME [facility] please service CPAP machine for leaks.   Continue auto 10-15, mask of choice, humidifier, supplies, Airview/card  Please call if we can help

## 2019-10-04 NOTE — Progress Notes (Signed)
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 No concerns. Does note easier DOE. Occ wheeze with exertion in cold.  Using rescue inhaler each AM and occ at night before bed. Continues Wixela 250.  No angina.   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  Doing well with CPAP, but he hears leak which may be internal to casing  ROS-see HPI   + = positive Constitutional:    weight loss, night sweats, fevers, chills, fatigue, lassitude. HEENT:    headaches, difficulty swallowing, tooth/dental problems, sore throat,       sneezing, itching, ear ache, nasal congestion, post nasal drip, snoring CV:    chest pain, orthopnea, PND, swelling in lower extremities, anasarca,                                                         dizziness, + palpitations Resp:   + shortness of breath with exertion or at rest.                 productive cough,  + non-productive cough, coughing up of blood.              change in color of mucus.  wheezing.   Skin:    rash or  lesions. GI:  No-   heartburn, indigestion, abdominal pain, nausea, vomiting, diarrhea,                 change in bowel habits, loss of appetite GU: dysuria, change in color of urine, no urgency or frequency.   flank pain. MS:   joint pain, stiffness, decreased range of motion, back pain. Neuro-     nothing unusual Psych:  change in mood or affect.  depression or anxiety.   memory loss.  OBJ- Physical Exam General- Alert, Oriented, Affect-appropriate, Distress- none acute, + morbidly obese Skin- rash-none, lesions- none, excoriation- none Lymphadenopathy- none Head- atraumatic            Eyes- Gross vision intact, PERRLA, conjunctivae and secretions clear            Ears- Hearing, canals-normal            Nose- Clear, no-Septal dev, mucus, polyps, erosion, perforation             Throat- Mallampati IV , mucosa clear , drainage- none, tonsils- atrophic Neck- flexible , trachea midline, no stridor , thyroid nl, carotid no bruit Chest - symmetrical excursion , unlabored  Heart/CV- RRR , no murmur , no gallop  , no rub, nl s1 s2                           - JVD- none , edema+, stasis changes+, varices- none           Lung- clear to P&A, wheeze- none, cough- none , dullness-none, rub- none           Chest wall-  Abd-  Br/ Gen/ Rectal- Not done, not indicated Extrem- cyanosis- none, clubbing, none, atrophy- none, strength- nl Neuro- grossly intact to observation

## 2019-10-14 NOTE — Assessment & Plan Note (Signed)
Comfortable control without exceptional discomfort on summer trip. Discussed altitude and exertion.

## 2019-10-14 NOTE — Assessment & Plan Note (Signed)
Benefits from CPAP with good compliance and control Plan- DME to service machine for possible leak. Continue auto 10-15.

## 2020-04-11 ENCOUNTER — Ambulatory Visit
Admission: RE | Admit: 2020-04-11 | Discharge: 2020-04-11 | Disposition: A | Discharge status: Home / Self Care | Payer: Medicare PPO | Source: Ambulatory Visit | Attending: Gastroenterology | Admitting: Gastroenterology

## 2020-04-11 ENCOUNTER — Other Ambulatory Visit: Payer: Self-pay | Admitting: Gastroenterology

## 2020-04-11 ENCOUNTER — Other Ambulatory Visit: Payer: Self-pay

## 2020-04-11 DIAGNOSIS — K5900 Constipation, unspecified: Secondary | ICD-10-CM

## 2020-04-11 DIAGNOSIS — R140 Abdominal distension (gaseous): Secondary | ICD-10-CM

## 2020-04-16 ENCOUNTER — Other Ambulatory Visit: Payer: Self-pay | Admitting: Gastroenterology

## 2020-04-16 ENCOUNTER — Ambulatory Visit
Admission: RE | Admit: 2020-04-16 | Discharge: 2020-04-16 | Disposition: A | Discharge status: Home / Self Care | Payer: Medicare PPO | Source: Ambulatory Visit | Attending: Gastroenterology | Admitting: Gastroenterology

## 2020-04-16 DIAGNOSIS — K5900 Constipation, unspecified: Secondary | ICD-10-CM

## 2020-04-18 ENCOUNTER — Other Ambulatory Visit: Payer: Self-pay | Admitting: Gastroenterology

## 2020-04-18 ENCOUNTER — Ambulatory Visit
Admission: RE | Admit: 2020-04-18 | Discharge: 2020-04-18 | Disposition: A | Discharge status: Home / Self Care | Payer: Medicare PPO | Source: Ambulatory Visit | Attending: Gastroenterology | Admitting: Gastroenterology

## 2020-04-18 DIAGNOSIS — K5900 Constipation, unspecified: Secondary | ICD-10-CM

## 2020-08-14 DIAGNOSIS — G4733 Obstructive sleep apnea (adult) (pediatric): Secondary | ICD-10-CM

## 2020-08-14 NOTE — Telephone Encounter (Signed)
Please order his DME change autopap range to 5- 10. Ask him to see if this helps and if not, then let me know and we will look at next steps.

## 2020-08-14 NOTE — Telephone Encounter (Signed)
Dr. Annamaria Boots, please see mychart message sent by pt and advise: Lanier Prude, MD 21 minutes ago (1:52 PM)   Ralph Goodman -- Could we chat by phone for 5 - 10 minutes?   I really need your advice.  My distension is disabling.  Upper and lower endoscopies show no causes for the distension.   A CT scan with contrast last Friday showed no mechanical obstruction.   I am on a second antibiotic course for SIBO.   My hydrogen and methane breath tests (home kit) are normal.    I am on a FODMAP elimination diet, with no effect.    We are to the point where I am thinking it might be aerophagia.   I have now read a bit more about such in CPAP usage.  A logical next step for me is getting advice from you about possible CPAP adjustments:  Newer device?    Now on nasal canulas, which I like; my reading suggest they may be better than full face re' aerophagia.   Adjusting end expiratory pressure?  ANY ideas/advice that you have ........     Could we please chat?   [name]

## 2020-08-15 NOTE — Telephone Encounter (Signed)
Dr. Maple Hudson, please see and advise on mychart message. Thanks!  THANK YOU SO MUCH.  I still am amazed at the over the air DME changes/upgrades/read outs.   Did Dr. Maple Hudson have any suggestions about sleep positions that might help?  I have read Left Side Down, but that makes little sense to me.   Dr.  Sharol Harness

## 2020-08-20 NOTE — Telephone Encounter (Signed)
Order- please ask his DME to change autopap to 8-12. If this isn't comfortable I would like to set up either in-person or televisit with Dr Sharol Harness to see what we can do with his CPAP/

## 2020-08-20 NOTE — Addendum Note (Signed)
Addended by: Sandra Cockayne on: 08/20/2020 03:37 PM   Modules accepted: Orders

## 2020-08-20 NOTE — Telephone Encounter (Signed)
Hello Dr. Maple Hudson, please advise on mychart message below, thanks!:  Ralph Goodman  -- The new pressure settings on my Air Sense 10 were changed late Friday, 7/1.   Ralph Goodman at [place] was very helpful.   I have not done well over the four nights on 5cm H20.   I feel air hungry and have a higher baseline respiratory rate.  I talked to [facility] this AM and she cannot make re-adjustments without an order from you.   She looked at my pressures over the last four nights, and the highest I required was 9.2 cm/H2O.  My events were not increased (0.7/hour).  But I did not sleep at all soundly so I don't know how reliable that is.   I need some help.  Could we go back up, at least to 8 cm?   Ralph Goodman is going to fax your office, knowing I would be trying to reach you.   Thanks,   [name]

## 2020-08-21 NOTE — Telephone Encounter (Signed)
Routing My Chart message to Dr. Maple Hudson as FYI  Please let Dr. Maple Hudson know that I did very well on the new lower settings.   I talked to [facility] at [facility] today, and she said the peak pressure I required was 9 cmH20.   My events were 0.1 per hour.   I have an appointment with Dr. Maple Hudson in August.   I can wait for then to discuss more fully.   Thanks,  Dr. Sharol Harness  Nothing further needed at this time.

## 2020-10-02 NOTE — Progress Notes (Signed)
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 Feels habituated and that he needs CPAP. We looked at download report and with his weight loss, discussed trying without. Decided to do HST off CPAP.  Notes  more DOE- walking less. Never able to clear peripheral edema.  ROS-see HPI   + = positive Constitutional:    +weight loss, night sweats, fevers, chills, fatigue, lassitude. HEENT:    headaches, difficulty swallowing, tooth/dental problems, sore throat,       sneezing, itching, ear ache, nasal congestion, post nasal drip, snoring CV:    chest pain, orthopnea, PND, +swelling in lower extremities, anasarca,                                                         dizziness, + palpitations Resp:   + shortness of breath with exertion or at rest.                 productive cough,  + non-productive  cough, coughing up of blood.              change in color of mucus.  wheezing.   Skin:    rash or lesions. GI:  No-   heartburn, indigestion, abdominal pain, nausea, vomiting, diarrhea,                 change in bowel habits, loss of appetite GU: dysuria, change in color of urine, no urgency or frequency.   flank pain. MS:   joint pain, stiffness, decreased range of motion, back pain. Neuro-     nothing unusual Psych:  change in mood or affect.  depression or anxiety.   memory loss.  OBJ- Physical Exam General- Alert, Oriented, Affect-appropriate, Distress- none acute, + morbidly obese Skin- rash-none, lesions- none, excoriation- none Lymphadenopathy- none Head- atraumatic            Eyes- Gross vision intact, PERRLA, conjunctivae and secretions clear            Ears- Hearing, canals-normal            Nose- Clear, no-Septal dev, mucus, polyps, erosion, perforation             Throat- Mallampati IV , mucosa clear , drainage- none, tonsils- atrophic Neck- flexible , trachea midline, no stridor , thyroid nl, carotid no bruit Chest -  symmetrical excursion , unlabored           Heart/CV- RRR , no murmur , no gallop  , no rub, nl s1 s2                           - JVD- none , edema+, stasis changes+, varices- none           Lung- + somewhat labored with conversation, more comfortable with mask off, clear to P&A, wheeze- none, cough- none , dullness-none, rub- none           Chest wall-  Abd-  Br/ Gen/ Rectal- Not done, not indicated Extrem- cyanosis- none, clubbing, none, atrophy- none, strength- nl Neuro- grossly intact to observation

## 2020-10-03 ENCOUNTER — Other Ambulatory Visit: Payer: Self-pay

## 2020-10-03 ENCOUNTER — Encounter: Payer: Self-pay | Admitting: Internal Medicine

## 2020-10-03 ENCOUNTER — Ambulatory Visit: Payer: Medicare PPO | Admitting: Internal Medicine

## 2020-10-03 VITALS — BP 120/60 | HR 61 | Temp 98.2°F | Ht 71.0 in | Wt 252.0 lb

## 2020-10-03 DIAGNOSIS — G4733 Obstructive sleep apnea (adult) (pediatric): Secondary | ICD-10-CM

## 2020-10-03 NOTE — Patient Instructions (Addendum)
Order- DME [facility]-  please change to auto 10-15, EPR 4  Order- schedule home sleep test    dx OSA  Please call if we can help

## 2020-10-04 NOTE — Telephone Encounter (Signed)
Hello Dr. Maple Hudson, please see mychart message below, thanks!  [name] -- I will wait to hear from someone about getting the Home Sleep Study equipment and instructions.   I like the idea of doing this.  We will be OOT for 2+ weeks over Labor Day.   I will expect [facility] to give me a call about the CPAP adjustments.   Thanks,   [name]

## 2020-10-07 ENCOUNTER — Encounter: Payer: Self-pay | Admitting: Internal Medicine

## 2020-10-07 NOTE — Assessment & Plan Note (Signed)
He feels benefit from CPAP. We note very good control. Plan- HST off CPAP for update. Change back to auto 10-15/ EPR 4  order his request.

## 2020-12-05 ENCOUNTER — Ambulatory Visit: Payer: Medicare PPO

## 2020-12-05 ENCOUNTER — Other Ambulatory Visit: Payer: Self-pay

## 2020-12-05 DIAGNOSIS — G4733 Obstructive sleep apnea (adult) (pediatric): Secondary | ICD-10-CM | POA: Diagnosis not present

## 2020-12-13 DIAGNOSIS — G4733 Obstructive sleep apnea (adult) (pediatric): Secondary | ICD-10-CM | POA: Diagnosis not present

## 2020-12-16 DIAGNOSIS — G4733 Obstructive sleep apnea (adult) (pediatric): Secondary | ICD-10-CM

## 2020-12-19 NOTE — Telephone Encounter (Signed)
Dr Maple Hudson, Please advise the following:  Ralph Goodman -- The two messages you recently sent had no content which I could see/open.  I would LOVE to know the results of my home sleep apnea test, but it cannot be seen on My Chart.                   Can someone call me [phone]) or email me ([email or web address]).     [name]

## 2020-12-19 NOTE — Telephone Encounter (Signed)
Hi Dr Maple Hudson thanks for addressing the question.   Dr Sharol Harness states the pressure was never changed by [facility] for 10-15cm with the EPR of 4. Patient states he would like to continue wearing the CPAP. I will contact the [facility] and try to figure out what happen. Thank you.

## 2020-12-19 NOTE — Telephone Encounter (Signed)
Please send refresher order to [facility] D[facility]       Continue CPAP auto 10-15, EPR 4, mask of choice, humidifier, supplies, AirView/ card

## 2020-12-19 NOTE — Telephone Encounter (Signed)
Home sleep test showed very mild obstructive sleep apnea with AHI of 8.1 episodes/ hour. Original score in 2008 was 30.9/ hr when recorded weight was 225 lbs. 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

## 2021-03-13 ENCOUNTER — Other Ambulatory Visit: Payer: Self-pay | Admitting: Orthopaedic Surgery

## 2021-03-13 DIAGNOSIS — M79671 Pain in right foot: Secondary | ICD-10-CM

## 2021-03-21 ENCOUNTER — Ambulatory Visit
Admission: RE | Admit: 2021-03-21 | Discharge: 2021-03-21 | Disposition: A | Discharge status: Home / Self Care | Payer: Medicare PPO | Source: Ambulatory Visit | Attending: Orthopaedic Surgery | Admitting: Orthopaedic Surgery

## 2021-03-21 DIAGNOSIS — M79671 Pain in right foot: Secondary | ICD-10-CM

## 2021-04-11 ENCOUNTER — Emergency Department (HOSPITAL_COMMUNITY): Payer: Medicare PPO

## 2021-04-11 ENCOUNTER — Observation Stay (HOSPITAL_COMMUNITY)
Admission: EM | Admit: 2021-04-11 | Discharge: 2021-04-12 | Disposition: A | Discharge status: Home / Self Care | Payer: Medicare PPO | Attending: Internal Medicine | Admitting: Internal Medicine

## 2021-04-11 DIAGNOSIS — I77819 Aortic ectasia, unspecified site: Secondary | ICD-10-CM | POA: Insufficient documentation

## 2021-04-11 DIAGNOSIS — Z7901 Long term (current) use of anticoagulants: Secondary | ICD-10-CM | POA: Diagnosis not present

## 2021-04-11 DIAGNOSIS — K219 Gastro-esophageal reflux disease without esophagitis: Secondary | ICD-10-CM | POA: Diagnosis not present

## 2021-04-11 DIAGNOSIS — E871 Hypo-osmolality and hyponatremia: Secondary | ICD-10-CM | POA: Insufficient documentation

## 2021-04-11 DIAGNOSIS — Z9989 Dependence on other enabling machines and devices: Secondary | ICD-10-CM | POA: Insufficient documentation

## 2021-04-11 DIAGNOSIS — E876 Hypokalemia: Secondary | ICD-10-CM | POA: Insufficient documentation

## 2021-04-11 DIAGNOSIS — I441 Atrioventricular block, second degree: Secondary | ICD-10-CM | POA: Insufficient documentation

## 2021-04-11 DIAGNOSIS — R002 Palpitations: Secondary | ICD-10-CM | POA: Insufficient documentation

## 2021-04-11 DIAGNOSIS — R0602 Shortness of breath: Secondary | ICD-10-CM | POA: Diagnosis not present

## 2021-04-11 DIAGNOSIS — Z20822 Contact with and (suspected) exposure to covid-19: Secondary | ICD-10-CM | POA: Insufficient documentation

## 2021-04-11 DIAGNOSIS — I119 Hypertensive heart disease without heart failure: Secondary | ICD-10-CM | POA: Insufficient documentation

## 2021-04-11 DIAGNOSIS — K589 Irritable bowel syndrome without diarrhea: Secondary | ICD-10-CM | POA: Insufficient documentation

## 2021-04-11 DIAGNOSIS — R0600 Dyspnea, unspecified: Secondary | ICD-10-CM | POA: Insufficient documentation

## 2021-04-11 DIAGNOSIS — R001 Bradycardia, unspecified: Secondary | ICD-10-CM | POA: Diagnosis not present

## 2021-04-11 DIAGNOSIS — R9431 Abnormal electrocardiogram [ECG] [EKG]: Secondary | ICD-10-CM | POA: Insufficient documentation

## 2021-04-11 DIAGNOSIS — Z79899 Other long term (current) drug therapy: Secondary | ICD-10-CM | POA: Diagnosis not present

## 2021-04-11 DIAGNOSIS — J45909 Unspecified asthma, uncomplicated: Secondary | ICD-10-CM | POA: Diagnosis not present

## 2021-04-11 DIAGNOSIS — G4733 Obstructive sleep apnea (adult) (pediatric): Secondary | ICD-10-CM | POA: Diagnosis not present

## 2021-04-11 DIAGNOSIS — D696 Thrombocytopenia, unspecified: Secondary | ICD-10-CM | POA: Insufficient documentation

## 2021-04-11 DIAGNOSIS — R197 Diarrhea, unspecified: Secondary | ICD-10-CM | POA: Insufficient documentation

## 2021-04-11 DIAGNOSIS — I452 Bifascicular block: Secondary | ICD-10-CM | POA: Insufficient documentation

## 2021-04-11 DIAGNOSIS — Z8679 Personal history of other diseases of the circulatory system: Secondary | ICD-10-CM | POA: Insufficient documentation

## 2021-04-11 DIAGNOSIS — I358 Other nonrheumatic aortic valve disorders: Secondary | ICD-10-CM | POA: Diagnosis not present

## 2021-04-11 DIAGNOSIS — R2681 Unsteadiness on feet: Secondary | ICD-10-CM | POA: Insufficient documentation

## 2021-04-11 DIAGNOSIS — R778 Other specified abnormalities of plasma proteins: Secondary | ICD-10-CM | POA: Insufficient documentation

## 2021-04-11 DIAGNOSIS — E785 Hyperlipidemia, unspecified: Secondary | ICD-10-CM | POA: Insufficient documentation

## 2021-04-11 DIAGNOSIS — K5900 Constipation, unspecified: Secondary | ICD-10-CM | POA: Insufficient documentation

## 2021-04-11 DIAGNOSIS — I872 Venous insufficiency (chronic) (peripheral): Secondary | ICD-10-CM | POA: Insufficient documentation

## 2021-04-11 DIAGNOSIS — M19071 Primary osteoarthritis, right ankle and foot: Secondary | ICD-10-CM | POA: Insufficient documentation

## 2021-04-11 DIAGNOSIS — I440 Atrioventricular block, first degree: Secondary | ICD-10-CM | POA: Insufficient documentation

## 2021-04-11 DIAGNOSIS — R2689 Other abnormalities of gait and mobility: Secondary | ICD-10-CM | POA: Insufficient documentation

## 2021-04-11 DIAGNOSIS — I10 Essential (primary) hypertension: Secondary | ICD-10-CM | POA: Insufficient documentation

## 2021-04-11 DIAGNOSIS — E878 Other disorders of electrolyte and fluid balance, not elsewhere classified: Secondary | ICD-10-CM | POA: Diagnosis not present

## 2021-04-11 LAB — COMPREHENSIVE METABOLIC PANEL
ALT: 38 U/L (ref 0–44)
AST: 36 U/L (ref 15–41)
Albumin: 3.4 g/dL — ABNORMAL LOW (ref 3.5–5.0)
Alkaline Phosphatase: 60 U/L (ref 38–126)
Anion gap: 13 (ref 5–15)
BUN: 18 mg/dL (ref 8–23)
CO2: 23 mmol/L (ref 22–32)
Calcium: 8.7 mg/dL — ABNORMAL LOW (ref 8.9–10.3)
Chloride: 97 mmol/L — ABNORMAL LOW (ref 98–111)
Creatinine, Ser: 0.91 mg/dL (ref 0.61–1.24)
GFR, Estimated: >60 mL/min (ref >60)
Glucose, Bld: 120 mg/dL — ABNORMAL HIGH (ref 70–99)
Potassium: 3.4 mmol/L — ABNORMAL LOW (ref 3.5–5.1)
Sodium: 133 mmol/L — ABNORMAL LOW (ref 135–145)
Total Bilirubin: 1.1 mg/dL (ref 0.3–1.2)
Total Protein: 6.3 g/dL — ABNORMAL LOW (ref 6.5–8.1)

## 2021-04-11 LAB — CBC
HCT: 39.8 % (ref 39.0–52.0)
Hemoglobin: 14 g/dL (ref 13.0–17.0)
MCH: 32 pg (ref 26.0–34.0)
MCHC: 35.2 g/dL (ref 30.0–36.0)
MCV: 91.1 fL (ref 80.0–100.0)
Platelets: 138 10*3/uL — ABNORMAL LOW (ref 150–400)
RBC: 4.37 MIL/uL (ref 4.22–5.81)
RDW: 12.8 % (ref 11.5–15.5)
WBC: 8.5 10*3/uL (ref 4.0–10.5)
nRBC: 0 % (ref 0.0–0.2)

## 2021-04-11 LAB — RESP PANEL BY RT-PCR (FLU A&B, COVID) ARPGX2
Influenza A by PCR: NEGATIVE
Influenza B by PCR: NEGATIVE
SARS Coronavirus 2 by RT PCR: NEGATIVE

## 2021-04-11 LAB — MAGNESIUM: Magnesium: 1.5 mg/dL — ABNORMAL LOW (ref 1.7–2.4)

## 2021-04-11 LAB — TROPONIN I (HIGH SENSITIVITY): Troponin I (High Sensitivity): 30 ng/L — ABNORMAL HIGH (ref <18)

## 2021-04-11 LAB — D-DIMER, QUANTITATIVE: D-Dimer, Quant: 0.62 ug/mL-FEU — ABNORMAL HIGH (ref 0.00–0.50)

## 2021-04-11 MED ORDER — MAGNESIUM SULFATE 2 GM/50ML IV SOLN
2.0000 g | Freq: Once | INTRAVENOUS | Status: AC
  Administered 2021-04-11: 2 g via INTRAVENOUS
  Filled 2021-04-11: qty 50

## 2021-04-11 NOTE — ED Triage Notes (Signed)
Pt from home via [facility], pulse ox showed arrhytmia so he called EMS. HR back & forth between 80 & 40's. Hx RBBB, denies CP/ pressure, endorses SHOB.

## 2021-04-11 NOTE — Consult Note (Signed)
Cardiology Consultation:   Patient ID: Ralph Goodman MRN: [id number]; DOB: May 04, 1940  Admit date: 04/11/2021 Date of Consult: 04/11/2021  Primary Care Provider: Juline Patch, MD Primary Cardiologist: None  Primary Electrophysiologist:  None    Patient Profile:   Ralph Goodman is a 81 y.o. male with a hx of hypertension, hyperlipidemia, GERD, and chronic constipation who is being seen today for the evaluation of bradycardia at the request of emergency department.  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  He notes that he had multiple consecutive days of poor sleep.  Also notes that over the last 24 hours he has had loose stools/diarrhea.  Yesterday during a nap with CPAP on, he noted that his pulse was changing from 80s to 40s on pulse oximeter.  He had vague symptoms that he attributed to being overly tired.  However given the change in his heart rate down to the 40s and his ongoing symptoms, will call EMS for evaluation.  There they noted him to be in a rhythm concerning for heart block.  That he was brought to the emergency department for further evaluation.  On initial presentation, he had significant electrolyte derangements with low potassium and low magnesium.  Initial EKG is concerning for high-grade 2 to 1 AV block.  Given this, cardiology was consulted for further recommendations.  No past medical history on file.  No past surgical history on file.   Home Medications:  Prior to Admission medications   Medication Sig Start Date End Date Taking? Authorizing Provider  acyclovir (ZOVIRAX) 400 MG tablet Take 400 mg by mouth daily. 05/21/16   [provider]  albuterol (PROAIR HFA) 108 (90 Base) MCG/ACT inhaler Inhale 2 puffs every 6 hours as needed- rescue 09/17/17   Baird Lyons D, MD   amLODipine (NORVASC) 5 MG tablet Take 5 mg by mouth daily. 04/20/16   [provider]  atorvastatin (LIPITOR) 10 MG tablet Take 10 mg by mouth daily. 05/17/16   [provider]  Budeson-Glycopyrrol-Formoterol (BREZTRI AEROSPHERE) 160-9-4.8 MCG/ACT AERO Inhale 2 puffs into the lungs in the morning and at bedtime. 06/21/19   Baird Lyons D, MD  chlorthalidone (HYGROTON) 25 MG tablet Take 25 mg by mouth daily.  07/20/19   [provider]  eplerenone (INSPRA) 25 MG tablet Take 25 mg by mouth 2 (two) times daily. 12/04/15   [provider]  irbesartan (AVAPRO) 150 MG tablet Take 1 tablet by mouth 2 (two) times daily. 05/27/16   [provider]  montelukast (SINGULAIR) 10 MG tablet Take 10 mg by mouth daily. 08/26/13   [provider]  pantoprazole (PROTONIX) 40 MG tablet Take 40 mg by mouth 2 (two) times daily. 04/14/16   [provider]    Inpatient Medications: Scheduled Meds:  Continuous Infusions:  magnesium sulfate bolus IVPB     PRN Meds:   Allergies:   No Known Allergies  Social History:   Social History   Socioeconomic History   Marital status: Married    Spouse name: Not on file   Number of children: Not on file   Years of education: Not on file   Highest education level: Not on file  Occupational History   Not on file  Tobacco Use   Smoking status: Never   Smokeless tobacco: Never  Substance and Sexual Activity   Alcohol use: Yes  Alcohol/week: 1.0 - 2.0 standard drink    Types: 1 - 2 Glasses of wine per week   Drug use: Not on file   Sexual activity: Not on file  Other Topics Concern   Not on file  Social History Narrative   Not on file   Social Determinants of Health   Financial Resource Strain: Not on file  Food Insecurity: Not on file  Transportation Needs: Not on file  Physical Activity: Not on file  Stress: Not on file  Social Connections: Not on file  Intimate Partner Violence: Not on file     Family History:   No family history on file.   Review of Systems: [y] = yes, [ ]  = no    General: Weight gain [ ] ; Weight loss [ ] ; Anorexia [ ] ; Fatigue [ ] ; Fever [ ] ; Chills [ ] ; Weakness [ ]   Cardiac: Chest pain/pressure [ ] ; Resting SOB [ ] ; Exertional SOB [ ] ; Orthopnea [ ] ; Pedal Edema [ ] ; Palpitations [ ] ; Syncope [ ] ; Presyncope [ ] ; Paroxysmal nocturnal dyspnea[ ]   Pulmonary: Cough [ y]; Wheezing[ ] ; Hemoptysis[ ] ; Sputum [ ] ; Snoring [ ]   GI: Vomiting[ ] ; Dysphagia[ ] ; Melena[ ] ; Hematochezia [ ] ; Heartburn[ ] ; Abdominal pain [ ] ; Constipation [y]; Diarrhea [ yy]; BRBPR [ ]   GU: Hematuria[ ] ; Dysuria [ ] ; Nocturia[ ]   Vascular: Pain in legs with walking [ ] ; Pain in feet with lying flat [ ] ; Non-healing sores [ ] ; Stroke [ ] ; TIA [ ] ; Slurred speech [ ] ;  Neuro: Headaches[ ] ; Vertigo[ ] ; Seizures[ ] ; Paresthesias[ ] ;Blurred vision [ ] ; Diplopia [ ] ; Vision changes [ ]   Ortho/Skin: Arthritis [ ] ; Joint pain [ ] ; Muscle pain [ ] ; Joint swelling [ ] ; Back Pain [ ] ; Rash [ ]   Psych: Depression[ ] ; Anxiety[ ]   Heme: Bleeding problems [ ] ; Clotting disorders [ ] ; Anemia [ ]   Endocrine: Diabetes [ ] ; Thyroid dysfunction[ ]   Physical Exam/Data:   Vitals:   04/11/21 2210 04/11/21 2215 04/11/21 2230 04/11/21 2300  BP: (!) 140/47 (!) 137/51 (!) 122/57 (!) 141/60  Pulse: (!) 45 (!) 43 (!) 42 76  Resp: 20 (!) 22 19   Temp:      TempSrc:      SpO2: 99% 99% 99% 99%   No intake or output data in the 24 hours ending 04/11/21 2321 There were no vitals filed for this visit. There is no height or weight on file to calculate BMI.  General:  Well nourished, well developed, in no acute distress HEENT: normal Lymph: no adenopathy Neck: no JVD Endocrine:  No thryomegaly Vascular: No carotid bruits; FA pulses 2+ bilaterally without bruits  Cardiac:  normal S1, S2; RRR; no murmur  Lungs:  clear to auscultation bilaterally, no wheezing, rhonchi or rales  Abd: soft, nontender, no hepatomegaly   Ext: 2+ edema bilaterally Musculoskeletal:  No deformities, BUE and BLE strength normal and equal Skin: warm and dry  Neuro:  CNs 2-12 intact, no focal abnormalities noted Psych:  Normal affect   EKG:  The EKG was personally reviewed and demonstrates:  concern for 2:1 high grade AVB block on initial ECG Telemetry:  Telemetry was personally reviewed and demonstrates:  Sinus rhythm with frequent couplets and PVCs   Relevant CV Studies: none  Laboratory Data:  Chemistry Recent Labs  Lab 04/11/21 2201  NA 133*  K 3.4*  CL 97*  CO2 23  GLUCOSE 120*  BUN 18  CREATININE  0.91  CALCIUM 8.7*  GFRNONAA >60  ANIONGAP 13    Recent Labs  Lab 04/11/21 2201  PROT 6.3*  ALBUMIN 3.4*  AST 36  ALT 38  ALKPHOS 60  BILITOT 1.1   Hematology Recent Labs  Lab 04/11/21 2201  WBC 8.5  RBC 4.37  HGB 14.0  HCT 39.8  MCV 91.1  MCH 32.0  MCHC 35.2  RDW 12.8  PLT 138*   Cardiac EnzymesNo results for input(s): TROPONINI in the last 168 hours. No results for input(s): TROPIPOC in the last 168 hours.  BNPNo results for input(s): BNP, PROBNP in the last 168 hours.  DDimer  Recent Labs  Lab 04/11/21 2201  DDIMER 0.62*    Radiology/Studies:  DG Chest Portable 1 View  Result Date: 04/11/2021 CLINICAL DATA:  Shortness of breath EXAM: PORTABLE CHEST 1 VIEW COMPARISON:  06/08/2019 FINDINGS: Cardiomegaly. Chronic scarring and pleural thickening at the right lung base, stable. Left lung clear. No acute infiltrates. No acute bony abnormality. IMPRESSION: Stable chronic changes at the right lung base. Cardiomegaly. No active disease. Electronically Signed   By: Rolm Baptise M.D.   On: 04/11/2021 22:24    Assessment and Plan:   Bradycardia and frequent PVCs.  My suspicion that this is largely driven by his electrolyte derangements.  Agree with ongoing electrolyte repletion and admission to medicine for work-up on why his magnesium was so low.  Continue to monitor on telemetry.  If after  electrolyte repletion, he had any signs of heart block, I think he warrants ischemic evaluation.  However if his rhythm normalizes, no need for immediate evaluation.  Would order echo to evaluate function.  If no further episodes, would recommend discharge with 14-day Holter monitor.      For questions or updates, please contact [place] Please consult [email or web address] for contact info under     Signed, Doyne Keel, MD  04/11/2021 11:21 PM

## 2021-04-11 NOTE — ED Triage Notes (Signed)
Pt had 324 ASA PTA

## 2021-04-11 NOTE — ED Provider Notes (Signed)
[facility] EMERGENCY DEPARTMENT Provider Note   CSN: [id number] Arrival date & time: 04/11/21  2158     History  Chief Complaint  Patient presents with   Irregular Heart Beat   Bradycardia    Ralph Goodman is a 81 y.o. male.  81 year old male presents with palpitations and increased shortness of breath.  Patient states he was going to sleep this evening and putting on his CPAP when he noticed that he felt more short of breath his pulse oximetry was lower than normal and that he was having variations in his heart rate.  Does have a history of PVCs in the past.  He denies any chest pain or chest pressure.  No recent URI symptoms.  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  Called EMS and was transported here      Home Medications Prior to Admission medications   Medication Sig Start Date End Date Taking? Authorizing Provider  acyclovir (ZOVIRAX) 400 MG tablet Take 400 mg by mouth daily. 05/21/16   [provider]  albuterol (PROAIR HFA) 108 (90 Base) MCG/ACT inhaler Inhale 2 puffs every 6 hours as needed- rescue 09/17/17   Jetty Duhamel D, MD  amLODipine (NORVASC) 5 MG tablet Take 5 mg by mouth daily. 04/20/16   [provider]  atorvastatin (LIPITOR) 10 MG tablet Take 10 mg by mouth daily. 05/17/16   [provider]  Budeson-Glycopyrrol-Formoterol (BREZTRI AEROSPHERE) 160-9-4.8 MCG/ACT AERO Inhale 2 puffs into the lungs in the morning and at bedtime. 06/21/19   Jetty Duhamel D, MD  chlorthalidone (HYGROTON) 25 MG tablet Take 25 mg by mouth daily.  07/20/19   [provider]  eplerenone (INSPRA) 25 MG tablet Take 25 mg by mouth 2 (two) times daily. 12/04/15   [provider]  irbesartan (AVAPRO) 150 MG tablet Take 1 tablet by mouth 2 (two) times daily. 05/27/16   [provider]  montelukast (SINGULAIR) 10 MG tablet Take 10 mg by mouth daily. 08/26/13   [provider]  pantoprazole (PROTONIX) 40  MG tablet Take 40 mg by mouth 2 (two) times daily. 04/14/16   [provider]      Allergies    Patient has no known allergies.    Review of Systems   Review of Systems  Physical Exam Updated Vital Signs There were no vitals taken for this visit. Physical Exam  ED Results / Procedures / Treatments   Labs (all labs ordered are listed, but only abnormal results are displayed) Labs Reviewed  RESP PANEL BY RT-PCR (FLU A&B, COVID) ARPGX2  CBC  MAGNESIUM  COMPREHENSIVE METABOLIC PANEL  BRAIN NATRIURETIC PEPTIDE  TROPONIN I (HIGH SENSITIVITY)    EKG EKG Interpretation  Date/Time:  Friday April 11 2021 22:05:45 EST Ventricular Rate:  44 PR Interval:  165 QRS Duration: 161 QT Interval:  483 QTC Calculation: 414 R Axis:   -84 Text Interpretation: Sinus bradycardia Multiple premature complexes, vent & supraven RBBB and LAFB Confirmed by Lorre Nick ([id number]) on 04/11/2021 10:09:15 PM  Radiology No results found.  Procedures Procedures    Medications Ordered in ED Medications - No data to display  ED Course/ Medical Decision Making/ A&P                           Medical Decision Making Amount and/or Complexity of Data Reviewed Labs: ordered.  Risk Prescription drug management.   81 year old male presented with  palpitations and shortness of breath.  Considered etiologies such as PE, pneumonia, ACS.  Patient's chest x-ray without acute findings.  D-dimer is negative therefore feel that PE is less likely.  EKG per my interpretation shows sinus bradycardia with multiple PVCs.  Does have slightly low magnesium at 1.5 and that was replenished with mag here.  Troponin is elevated at 30.  Patient will require observation for evaluation of his bradycardia in the setting of elevated troponin        Final Clinical Impression(s) / ED Diagnoses Final diagnoses:  None    Rx / DC Orders ED Discharge Orders     None         Lorre Nick, MD 04/11/21  2310

## 2021-04-12 ENCOUNTER — Observation Stay (HOSPITAL_BASED_OUTPATIENT_CLINIC_OR_DEPARTMENT_OTHER): Payer: Medicare PPO

## 2021-04-12 DIAGNOSIS — R0609 Other forms of dyspnea: Secondary | ICD-10-CM | POA: Diagnosis not present

## 2021-04-12 DIAGNOSIS — I441 Atrioventricular block, second degree: Secondary | ICD-10-CM

## 2021-04-12 DIAGNOSIS — R001 Bradycardia, unspecified: Secondary | ICD-10-CM | POA: Diagnosis not present

## 2021-04-12 LAB — TROPONIN I (HIGH SENSITIVITY)
Troponin I (High Sensitivity): 31 ng/L — ABNORMAL HIGH (ref <18)
Troponin I (High Sensitivity): 36 ng/L — ABNORMAL HIGH (ref <18)
Troponin I (High Sensitivity): 52 ng/L — ABNORMAL HIGH (ref <18)

## 2021-04-12 LAB — ECHOCARDIOGRAM COMPLETE
Area-P 1/2: 3.27 cm2
S' Lateral: 3.7 cm

## 2021-04-12 LAB — BASIC METABOLIC PANEL
Anion gap: 12 (ref 5–15)
BUN: 16 mg/dL (ref 8–23)
CO2: 23 mmol/L (ref 22–32)
Calcium: 8.6 mg/dL — ABNORMAL LOW (ref 8.9–10.3)
Chloride: 98 mmol/L (ref 98–111)
Creatinine, Ser: 0.85 mg/dL (ref 0.61–1.24)
GFR, Estimated: >60 mL/min (ref >60)
Glucose, Bld: 115 mg/dL — ABNORMAL HIGH (ref 70–99)
Potassium: 3.2 mmol/L — ABNORMAL LOW (ref 3.5–5.1)
Sodium: 133 mmol/L — ABNORMAL LOW (ref 135–145)

## 2021-04-12 LAB — CBC
HCT: 37.7 % — ABNORMAL LOW (ref 39.0–52.0)
Hemoglobin: 13.3 g/dL (ref 13.0–17.0)
MCH: 32 pg (ref 26.0–34.0)
MCHC: 35.3 g/dL (ref 30.0–36.0)
MCV: 90.8 fL (ref 80.0–100.0)
Platelets: 121 10*3/uL — ABNORMAL LOW (ref 150–400)
RBC: 4.15 MIL/uL — ABNORMAL LOW (ref 4.22–5.81)
RDW: 12.8 % (ref 11.5–15.5)
WBC: 8.7 10*3/uL (ref 4.0–10.5)
nRBC: 0 % (ref 0.0–0.2)

## 2021-04-12 LAB — BRAIN NATRIURETIC PEPTIDE: B Natriuretic Peptide: 97.8 pg/mL (ref 0.0–100.0)

## 2021-04-12 LAB — MAGNESIUM
Magnesium: 1.9 mg/dL (ref 1.7–2.4)
Magnesium: 2 mg/dL (ref 1.7–2.4)

## 2021-04-12 LAB — POTASSIUM: Potassium: 3.3 mmol/L — ABNORMAL LOW (ref 3.5–5.1)

## 2021-04-12 MED ORDER — TRAZODONE HCL 50 MG PO TABS
25.0000 mg | ORAL_TABLET | Freq: Every evening | ORAL | Status: DC | PRN
  Administered 2021-04-12: 25 mg via ORAL
  Filled 2021-04-12: qty 1

## 2021-04-12 MED ORDER — POTASSIUM CHLORIDE IN NACL 20-0.9 MEQ/L-% IV SOLN
INTRAVENOUS | Status: DC
  Filled 2021-04-12 (×2): qty 1000

## 2021-04-12 MED ORDER — POTASSIUM CHLORIDE CRYS ER 20 MEQ PO TBCR: 20.0000 meq | EXTENDED_RELEASE_TABLET | Freq: Every day | ORAL | 0 refills | Status: DC

## 2021-04-12 MED ORDER — ADULT MULTIVITAMIN W/MINERALS CH: 1.0000 | ORAL_TABLET | Freq: Every day | ORAL | Status: DC

## 2021-04-12 MED ORDER — MAGNESIUM OXIDE -MG SUPPLEMENT 400 (240 MG) MG PO TABS: 400.0000 mg | ORAL_TABLET | Freq: Every day | ORAL | 3 refills | Status: DC

## 2021-04-12 MED ORDER — MAGNESIUM HYDROXIDE 400 MG/5ML PO SUSP
30.0000 mL | Freq: Every day | ORAL | Status: DC | PRN
  Filled 2021-04-12: qty 30

## 2021-04-12 MED ORDER — ATROPINE SULFATE 1 MG/10ML IJ SOSY: 0.5000 mg | PREFILLED_SYRINGE | Freq: Once | INTRAMUSCULAR | Status: DC | PRN

## 2021-04-12 MED ORDER — LORAZEPAM 0.5 MG PO TABS
0.5000 mg | ORAL_TABLET | ORAL | Status: DC | PRN
  Administered 2021-04-12: 0.5 mg via ORAL
  Filled 2021-04-12: qty 1

## 2021-04-12 MED ORDER — POTASSIUM CHLORIDE CRYS ER 20 MEQ PO TBCR: 40.0000 meq | EXTENDED_RELEASE_TABLET | Freq: Once | ORAL | Status: DC

## 2021-04-12 MED ORDER — ACETAMINOPHEN 650 MG RE SUPP: 650.0000 mg | Freq: Four times a day (QID) | RECTAL | Status: DC | PRN

## 2021-04-12 MED ORDER — ZOLPIDEM TARTRATE 5 MG PO TABS
5.0000 mg | ORAL_TABLET | Freq: Every day | ORAL | Status: DC
Start: 2021-04-12 — End: 2021-04-12

## 2021-04-12 MED ORDER — MAGNESIUM SULFATE IN D5W 1-5 GM/100ML-% IV SOLN
1.0000 g | Freq: Once | INTRAVENOUS | Status: AC
  Administered 2021-04-12: 1 g via INTRAVENOUS
  Filled 2021-04-12: qty 100

## 2021-04-12 MED ORDER — POTASSIUM CHLORIDE 20 MEQ PO PACK
40.0000 meq | PACK | Freq: Once | ORAL | Status: AC
  Administered 2021-04-12: 40 meq via ORAL
  Filled 2021-04-12: qty 2

## 2021-04-12 MED ORDER — MELATONIN 3 MG PO TABS: 3.0000 mg | ORAL_TABLET | Freq: Every day | ORAL | Status: DC

## 2021-04-12 MED ORDER — ENSURE ENLIVE PO LIQD: 237.0000 mL | Freq: Two times a day (BID) | ORAL | Status: DC

## 2021-04-12 MED ORDER — TEMAZEPAM 7.5 MG PO CAPS: 7.5000 mg | ORAL_CAPSULE | Freq: Every evening | ORAL | Status: DC | PRN

## 2021-04-12 MED ORDER — ONDANSETRON HCL 4 MG PO TABS: 4.0000 mg | ORAL_TABLET | Freq: Four times a day (QID) | ORAL | Status: DC | PRN

## 2021-04-12 MED ORDER — CHLORTHALIDONE 25 MG PO TABS: 25.0000 mg | ORAL_TABLET | Freq: Every day | ORAL | Status: DC

## 2021-04-12 MED ORDER — ACETAMINOPHEN 325 MG PO TABS
650.0000 mg | ORAL_TABLET | Freq: Four times a day (QID) | ORAL | Status: DC | PRN
Start: 2021-04-12 — End: 2021-04-12

## 2021-04-12 MED ORDER — ONDANSETRON HCL 4 MG/2ML IJ SOLN: 4.0000 mg | Freq: Four times a day (QID) | INTRAMUSCULAR | Status: DC | PRN

## 2021-04-12 MED ORDER — ENOXAPARIN SODIUM 40 MG/0.4ML IJ SOSY: 40.0000 mg | PREFILLED_SYRINGE | INTRAMUSCULAR | Status: DC

## 2021-04-12 NOTE — Progress Notes (Signed)
Initial Nutrition Assessment  DOCUMENTATION CODES:   Not applicable  INTERVENTION:   -Obtain new wt -MVI with minerals daily -Continue Ensure Enlive po BID, each supplement provides 350 kcal and 20 grams of protein.  -Liberalize diet to 2 gram sodium for wider variety of meal selections  NUTRITION DIAGNOSIS:   Increased nutrient needs related to acute illness as evidenced by estimated needs.  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  He denies any palpitations.  No worsening cough overall but he had a coughing spell today before this bradycardia and it cleared.  He felt as if he coughed mucous plug with mild dyspnea without wheezing.  No nausea or vomiting or abdominal pain.  No melena or bright red bleeding per rectum.  No dysuria, oliguria or hematuria, urgency or frequency or flank pain.  He noticed his heart rate has been dropping from 80s to 40s which is low for him.  No chest pain.  No headache or dizziness or blurred vision.  Pt admitted with symptomatic bradycardia.    Pt unavailable at time of visit. Attempted to speak with pt via call to hospital room phone, however, unable to reach. RD unable to obtain further nutrition-related history or complete nutrition-focused physical exam at this time.    Pt currently on a heart healthy diet. No meal completions data available to assess at this time.  Reviewed wt hx; no new wt for this admission. Noted distant history  weight history. Pt also with deep pitting edema, which is likely masking true weight loss as well as fat and muscle depletions.   Pt with increased nutritional needs and would benefit from addition of oral nutrition supplements.   Medications reviewed and include 0.9% NaCl with KCl 20 mEq/L infusion @ 100 ml/hr.   Labs reviewed: Na: 133, K: 3.2.    Diet Order:   Diet Order             Diet Heart Room service appropriate? Yes; Fluid consistency: Thin  Diet effective now                   EDUCATION NEEDS:   No education needs have been identified at this time  Skin:  Skin Assessment: Reviewed RN Assessment  Last BM:  04/11/21  Height:   Ht Readings from Last 1 Encounters:  10/03/20 5\' 11"  (1.803 m)    Weight:   Wt Readings from Last 1 Encounters:  10/03/20 114.3 kg    Ideal Body Weight:  78.2 kg  BMI:  There is no height or weight on file to calculate BMI.  Estimated Nutritional Needs:   Kcal:  2150-2350  Protein:  120-135 grams  Fluid:  > 2 L    Ralph Goodman, RD, LDN, [place] Registered Dietitian II Certified Diabetes Care and Education Specialist Please refer to [facility] for RD and/or RD on-call/weekend/after hours pager

## 2021-04-12 NOTE — Progress Notes (Signed)
°  Echocardiogram 2D Echocardiogram has been performed.  Ralph Goodman 04/12/2021, 12:40 PM

## 2021-04-12 NOTE — H&P (Addendum)
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  He denies any palpitations.  No worsening cough overall but he had a coughing spell today before this bradycardia and it cleared.  He felt as if he coughed mucous plug with mild dyspnea without wheezing.  No nausea or vomiting or abdominal pain.  No melena or bright red bleeding per rectum.  No dysuria, oliguria or hematuria, urgency or frequency or flank pain.  He noticed his heart rate has been dropping from 80s to 40s which is low for him.  No chest pain.  No headache or dizziness or blurred vision.  ED Course: When he came to the ER heart rate was 43 with otherwise normal vital signs.  Labs revealed mild hyponatremia and hypochloremia with a potassium of 3.4 and magnesium 1.5 and calcium was 8.7 with albumin of 3.4 and total protein 6.3.  High-sensitivity troponin I was 30 and later 31 CBC was within normal except for mild thrombocytopenia.  Influenza antigens and COVID-19 PCR came back negative.. EKG as reviewed by me : EKG showed sinus bradycardia with a rate of 43 with right bundle branch block and left anterior fascicular block.  Imaging:  Portable chest x-ray showed stable chronic changes in the right lung base with cardiomegaly and no active disease.  The  patient was given 2 g of IV magnesium sulfate.  He was evaluated by cardiology in the ER.  He will be admitted to a cardiac telemetry observation bed for further evaluation and management. PAST MEDICAL HISTORY:  Essential hypertension Dyslipidemia Osteoarthritis IBS GERD Obstructive sleep apnea Right bundle branch block Asthma Pancreatic insufficient Fatty liver Obesity  PAST SURGICAL HISTORY:  No past surgical history on file.  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  No pertinent familial diseases.  DRUG ALLERGIES:  No Known Allergies  REVIEW OF SYSTEMS:   ROS As per history of present illness. All pertinent systems were reviewed above. Constitutional, HEENT, cardiovascular, respiratory, GI, GU, musculoskeletal, neuro, psychiatric, endocrine, integumentary and hematologic systems were reviewed and are otherwise negative/unremarkable except for positive findings mentioned above in the HPI.   MEDICATIONS AT HOME:   Prior to Admission medications   Medication Sig Start Date End Date Taking? Authorizing Provider  acyclovir (ZOVIRAX) 400 MG tablet Take 400 mg by mouth daily. 05/21/16   [provider]  albuterol (PROAIR HFA) 108 (90 Base) MCG/ACT inhaler Inhale  2 puffs every 6 hours as needed- rescue 09/17/17   Baird Lyons D, MD  amLODipine (NORVASC) 5 MG tablet Take 5 mg by mouth daily. 04/20/16   [provider]  atorvastatin (LIPITOR) 10 MG tablet Take 10 mg by mouth daily. 05/17/16   [provider]  Budeson-Glycopyrrol-Formoterol (BREZTRI AEROSPHERE) 160-9-4.8 MCG/ACT AERO Inhale 2 puffs into the lungs in the morning and at bedtime. 06/21/19   Baird Lyons D, MD  chlorthalidone (HYGROTON) 25 MG tablet Take 25 mg by mouth daily.  07/20/19   [provider]  eplerenone (INSPRA) 25 MG tablet Take 25 mg by mouth 2 (two) times daily. 12/04/15   [provider]  irbesartan (AVAPRO) 150 MG tablet Take 1 tablet by mouth 2 (two) times daily. 05/27/16   [provider]  montelukast (SINGULAIR) 10 MG tablet Take 10 mg by mouth daily. 08/26/13   [provider]  pantoprazole (PROTONIX) 40 MG tablet Take 40 mg by mouth 2 (two) times daily. 04/14/16   [provider]      VITAL SIGNS:  Blood pressure 126/62, pulse 69, temperature 98.2 F (36.8 C), temperature source Oral, resp. rate 19, SpO2 99 %.  PHYSICAL EXAMINATION:  Physical Exam  GENERAL:  81 y.o.-year-old Caucasian male patient lying in the bed with no acute distress.  EYES: Pupils equal, round, reactive to light and accommodation. No scleral icterus. Extraocular muscles intact.  HEENT: Head atraumatic, normocephalic. Oropharynx and nasopharynx clear.  NECK:  Supple, no jugular venous distention. No thyroid enlargement, no tenderness.  LUNGS: Normal breath sounds bilaterally, no wheezing, rales,rhonchi or crepitation. No use of accessory muscles of respiration.  CARDIOVASCULAR: Regular rate and occasionally bradycardic rhythm in the 50s and later in the 60s, S1, S2 normal. No murmurs, rubs, or gallops.  ABDOMEN: Soft, nondistended, nontender. Bowel sounds present. No organomegaly or mass.  EXTREMITIES: No pedal edema, cyanosis, or clubbing.  NEUROLOGIC: Cranial nerves II through XII are intact. Muscle strength 5/5 in all extremities. Sensation intact. Gait not checked.  PSYCHIATRIC: The patient is alert and oriented x 3.  Normal affect and good eye contact. SKIN: No obvious rash, lesion, or ulcer.   LABORATORY PANEL:   CBC Recent Labs  Lab 04/12/21 0144  WBC 8.7  HGB 13.3  HCT 37.7*  PLT 121*    ------------------------------------------------------------------------------------------------------------------  Chemistries  Recent Labs  Lab 04/11/21 2201 04/12/21 0144  NA 133* 133*  K 3.4* 3.2*  CL 97* 98  CO2 23 23  GLUCOSE 120* 115*  BUN 18 16  CREATININE 0.91 0.85  CALCIUM 8.7* 8.6*  MG 1.5*  --   AST 36  --   ALT 38  --   ALKPHOS 60  --   BILITOT 1.1  --    ------------------------------------------------------------------------------------------------------------------  Cardiac Enzymes No results for input(s): TROPONINI in the last 168 hours. ------------------------------------------------------------------------------------------------------------------  RADIOLOGY:  DG Chest Portable 1 View  Result Date: 04/11/2021 CLINICAL DATA:  Shortness of breath EXAM: PORTABLE CHEST 1 VIEW COMPARISON:  06/08/2019 FINDINGS: Cardiomegaly. Chronic scarring and pleural thickening at the right lung base, stable. Left lung clear. No acute infiltrates. No acute bony abnormality. IMPRESSION: Stable chronic changes at the right lung base. Cardiomegaly. No active disease. Electronically Signed   By: Rolm Baptise M.D.   On: 04/11/2021 22:24      IMPRESSION AND PLAN:  Principal Problem:   Symptomatic bradycardia  1.  Symptomatic bradycardia -The patient will be admitted to an observation cardiac telemetry bed. - We will  follow serial troponins. - We will monitor for further bradycardia and utilize as needed IV atropine. - We will optimize his hypomagnesemia and hypokalemia with adequate replacement. - We will obtain a 2D echo. - Cardiology consult was obtained. - The patient was evaluated by Dr. Beverely Risen. - He will need a 14-day Holter monitor upon discharge.  2.  Hypokalemia and hypomagnesemia. - Both will be placed in follow-up.  3.  Essential hypertension. - We will hold off amlodipine and continue doxazosin and Avapro. - We will hold off diuretic therapy with  chlorthalidone and Lasix.Marland Kitchen  4.  Dyslipidemia. - We will continue statin therapy.  5.  Asthma. - We will continue Singulair.  6.  IBS. - We will continue Linzess.  7.  GERD. - We will continue GERD.  8.  Obstructive sleep apnea. - We will resume CPAP nightly.    DVT prophylaxis: Lovenox. Advanced Care Planning:  Code Status: full code. Family Communication:  The plan of care was discussed in details with the patient (and family). I answered all questions. The patient agreed to proceed with the above mentioned plan. Further management will depend upon hospital course. Disposition Plan: Back to previous home environment Consults called: Cardiology.   All the records are reviewed and case discussed with ED provider.  Status is: Observation  At the time of the admission, it appears that the appropriate admission status for this patient is inpatient.  This is judged to be reasonable and necessary in order to provide the required intensity of service to ensure the patient's safety given the presenting symptoms, physical exam findings and initial radiographic and laboratory data in the context of comorbid conditions.  The patient requires inpatient status due to high intensity of service, high risk of further deterioration and high frequency of surveillance required.  I certify that at the time of admission, it is my clinical judgment that the patient will require inpatient hospital care extending more than 2 midnights.                            Dispo: The patient is from: Home              Anticipated d/c is to: Home              Patient currently is not medically stable to d/c.              Difficult to place patient: No   Christel Mormon M.D on 04/12/2021 at 2:27 AM  [facility]   From 7 PM-7 AM, contact night-coverage [email or web address]  CC: Primary care physician; Juline Patch, MD

## 2021-04-12 NOTE — Discharge Summary (Addendum)
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  He had sleep deprivation over the last couple of nights, denied any palpitations.  No worsening cough overall however he had a coughing spell on the day of admission before the bradycardia and it cleared.  No GI bleeding.  He also noticed his heart rate has been dropping from 80s to 40s which is low for him.  No chest pain.  No dizziness or blurred vision.  When he came to the ER heart rate was 43.  In ER, heart rate was 43 otherwise normal vital signs.  Mild hyponatremia, hypochloremia, potassium 3.4, magnesium 1.5, calcium 8.7.  COVID-19 negative. EKG showed bradycardia, sinus with a rate of 43 with right bundle branch block and left anterior fascicular block   Assessment and Plan: Symptomatic bradycardia -EKG with 2:1 AV block with RBBB, left axis.  Cardiology was consulted and felt likely due to electrolyte derangements -2D echo showed EF of 60 to 65%, no regional wall motion abnormalities, right ventricular systolic function moderately reduced, left atrial size severely dilated. -Patient was seen by EP cardiology, Dr. Ladona Ridgel, recommended pacemaker insertion.  Patient is cleared to discharge home and return on Wednesday, 04/16/2021 for pacemaker  insertion.  Obstructive sleep apnea Continue CPAP  Hypokalemia, hypomagnesemia -Replaced and recommended to continue orally outpatient Per patient, he has Klor-Con 20 mEq at home and will continue.  He was given a prescription of magnesium oxide 400 mg daily. Chlorthalidone was placed on hold  Hypertension -Continue Cardura, amlodipine, Avapro  Consultants: Cardiology Procedures performed: 2D echo Disposition: Home Diet recommendation:  Discharge Diet Orders (From admission, onward)     Start     Ordered   04/12/21 0000  Diet - low sodium heart healthy        04/12/21 1426           Cardiac diet  DISCHARGE MEDICATION: Allergies as of 04/12/2021   No Known Allergies      Medication List     TAKE these medications    acyclovir 400 MG tablet Commonly known as: ZOVIRAX Take 400 mg by mouth daily.   albuterol 108 (90 Base) MCG/ACT inhaler Commonly known as: ProAir HFA Inhale 2 puffs every 6 hours as needed- rescue   amLODipine 5 MG tablet Commonly known as: NORVASC Take 5 mg by mouth daily.   atorvastatin 10 MG tablet Commonly known as: LIPITOR Take 10 mg by mouth daily.   Breztri Aerosphere 160-9-4.8 MCG/ACT Aero Generic drug: Budeson-Glycopyrrol-Formoterol Inhale 2 puffs into the lungs in the morning and at bedtime.   chlorthalidone 25 MG tablet Commonly known as: HYGROTON Take 1 tablet (25 mg total) by mouth daily. HOLD until follow-up with your cardiologist What changed: additional instructions   doxazosin 4 MG tablet Commonly known as: CARDURA Take 4 mg by mouth daily.   eplerenone 25 MG tablet Commonly known as: INSPRA Take  25 mg by mouth 2 (two) times daily.   irbesartan 150 MG tablet Commonly known as: AVAPRO Take 1 tablet by mouth 2 (two) times daily.   magnesium oxide 400 (240 Mg) MG tablet Commonly known as: MAG-OX Take 1 tablet (400 mg total) by mouth daily.   montelukast 10 MG tablet Commonly known as: SINGULAIR Take 10 mg by  mouth daily.   pantoprazole 40 MG tablet Commonly known as: PROTONIX Take 40 mg by mouth 2 (two) times daily.   potassium chloride SA 20 MEQ tablet Commonly known as: KLOR-CON M Take 1 tablet (20 mEq total) by mouth daily.               Durable Medical Equipment  (From admission, onward)           Start     Ordered   04/12/21 1429  For home use only DME Cane  Once        04/12/21 1428            Follow-up Information     Marinus Maw, MD Follow up.   Specialty: Cardiology Why: office will call you with appointment Contact information: [address] [place] [address] [phone]         Thea Alken, MD. Schedule an appointment as soon as possible for a visit in 2 week(s).   Specialty: Internal Medicine Why: for hospital follow-up Contact information: [address] [place] [address] [phone]         [facility] Follow up.   Why: cane Contact information: [address] [place] [address] [phone]                 Discharge Exam: Vitals:   04/12/21 0421 04/12/21 0800 04/12/21 0831 04/12/21 0834  BP: (!) 149/69  (!) 153/62   Pulse: 71 64 (!) 57   Resp: 17 16 15    Temp: 98 F (36.7 C)  97.6 F (36.4 C) 97.6 F (36.4 C)  TempSrc: Oral  Oral   SpO2: 99% 100% 99%      Physical Exam General: Alert and oriented x 3, NAD Cardiovascular: S1 S2 clear, RRR. No pedal edema b/l Respiratory: CTAB, no wheezing, rales or rhonchi Gastrointestinal: Soft, nontender, nondistended, NBS Ext: no pedal edema bilaterally Neuro: no new deficits Skin: No rashes Psych: Normal affect and demeanor, alert and oriented x3   Condition at discharge: good  The results of significant diagnostics from this hospitalization (including imaging, microbiology, ancillary and laboratory) are listed below for reference.   Imaging Studies: CT FOOT RIGHT WO CONTRAST  Result  Date: 03/23/2021 CLINICAL DATA:  Congenital deformity of the right foot. Callous along the plantar surface. EXAM: CT OF THE RIGHT FOOT WITHOUT CONTRAST TECHNIQUE: Multidetector CT imaging of the right foot was performed according to the standard protocol. Multiplanar CT image reconstructions were also generated. RADIATION DOSE REDUCTION: This exam was performed according to the departmental dose-optimization program which includes automated exposure control, adjustment of the mA and/or kV according to patient size and/or use of iterative reconstruction technique. COMPARISON:  None. FINDINGS: Bones/Joint/Cartilage Generalized osteopenia.  No fracture or dislocation. Hallux valgus with lateral subluxation of the first proximal phalanx relative to the metatarsal head. Severe osteoarthritis of the first MTP joint. Mild osteoarthritis of the first IP joint. Postsurgical changes from prior healed osteotomy involving the base of the first metatarsal. Mild osteoarthritis of the first TMT joint. Moderate osteoarthritis  of the second and third tarsometatarsal joints. Postsurgical changes involving the second and third metatarsal heads. 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 Small plantar calcaneal spur. Mild osteoarthritis of the tibiotalar joint. Relative pes planus. Subcortical extra-articular reactive cystic changes involving the lateral calcaneus, lateral talus and tip of the fibula as can be seen with posterolateral ankle impingement. Ligaments Ligaments are suboptimally evaluated by CT. Muscles and Tendons Generalized muscle atrophy. No intramuscular fluid collection or hematoma. Soft tissue No fluid collection or hematoma. No soft tissue mass. Peripheral vascular atherosclerotic disease. IMPRESSION: 1. Hallux valgus with lateral subluxation of  the first proximal phalanx relative to the metatarsal head. Severe osteoarthritis of the first MTP joint. 2. 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 3. Mild osteoarthritis of the first IP joint. 4. Mild osteoarthritis of the first TMT joint. 5. Moderate osteoarthritis of the second and third tarsometatarsal joints. 6. Subcortical extra-articular reactive cystic changes involving the lateral calcaneus, lateral talus and tip of the fibula as can be seen with posterolateral ankle impingement. Electronically Signed   By: Elige Ko M.D.   On: 03/23/2021 10:55   DG Chest Portable 1 View  Result Date: 04/11/2021 CLINICAL DATA:  Shortness of breath EXAM: PORTABLE CHEST 1 VIEW COMPARISON:  06/08/2019 FINDINGS: Cardiomegaly. Chronic scarring and pleural thickening at the right lung base, stable. Left lung clear. No acute infiltrates. No acute bony abnormality. IMPRESSION: Stable chronic changes at the right lung base. Cardiomegaly. No active disease. Electronically Signed   By: Charlett Nose M.D.   On: 04/11/2021 22:24   ECHOCARDIOGRAM COMPLETE  Result Date: 04/12/2021    ECHOCARDIOGRAM REPORT   Patient Name:   Ralph Goodman Date of Exam: 04/12/2021 Medical Rec #:  [id number]         Height:       71.0 in Accession #:    [id number]        Weight:       252.0 lb Date of Birth:  12-30-1940          BSA:          2.326 m Patient Age:    80 years          BP:           149/69 mmHg Patient Gender: M                 HR:           62 bpm. Exam Location:  Inpatient Procedure: 2D Echo Indications:    dyspnea  History:        Patient has prior history of Echocardiogram examinations, most                 recent 06/24/2006. Abnormal ECG, Arrythmias:Bradycardia; Risk                 Factors:Hypertension and Dyslipidemia.  Sonographer:    Delcie Roch  RDCS Referring Phys: [id number] JAN A MANSY IMPRESSIONS  1. Left ventricular ejection fraction, by estimation, is 60 to 65%. The left ventricle has normal function. The left ventricle has no regional wall motion abnormalities. The left ventricular internal cavity size was moderately dilated. Left ventricular diastolic parameters are indeterminate.  2. Right ventricular systolic function is moderately reduced. The  right ventricular size is normal. Mildly increased right ventricular wall thickness. Tricuspid regurgitation signal is inadequate for assessing PA pressure.  3. Left atrial size was severely dilated.  4. The mitral valve is normal in structure. Trivial mitral valve regurgitation. No evidence of mitral stenosis.  5. The aortic valve is tricuspid. Aortic valve regurgitation is not visualized. Aortic valve sclerosis is present, with no evidence of aortic valve stenosis.  6. Aortic dilatation noted. There is mild dilatation of the ascending aorta, measuring 40 mm.  7. The inferior vena cava is normal in size with greater than 50% respiratory variability, suggesting right atrial pressure of 3 mmHg. Comparison(s): Prior images unable to be directly viewed, comparison made by report only. FINDINGS  Left Ventricle: Left ventricular ejection fraction, by estimation, is 60 to 65%. The left ventricle has normal function. The left ventricle has no regional wall motion abnormalities. The left ventricular internal cavity size was moderately dilated. There is no left ventricular hypertrophy. Left ventricular diastolic parameters are indeterminate. Right Ventricle: The right ventricular size is normal. Right ventricular systolic function is moderately reduced. Tricuspid regurgitation signal is inadequate for assessing PA pressure. The tricuspid regurgitant velocity is 2.80 m/s, and with an assumed right atrial pressure of 3 mmHg, the estimated right ventricular systolic pressure is 34.4 mmHg. Left Atrium: Left atrial size was  severely dilated. Right Atrium: Right atrial size was normal in size. Pericardium: There is no evidence of pericardial effusion. Mitral Valve: The mitral valve is normal in structure. Trivial mitral valve regurgitation. No evidence of mitral valve stenosis. Tricuspid Valve: The tricuspid valve is normal in structure. Tricuspid valve regurgitation is mild . No evidence of tricuspid stenosis. Aortic Valve: The aortic valve is tricuspid. Aortic valve regurgitation is not visualized. Aortic valve sclerosis is present, with no evidence of aortic valve stenosis. Pulmonic Valve: The pulmonic valve was normal in structure. Pulmonic valve regurgitation is trivial. No evidence of pulmonic stenosis. Aorta: Aortic dilatation noted. There is mild dilatation of the ascending aorta, measuring 40 mm. Venous: The inferior vena cava is normal in size with greater than 50% respiratory variability, suggesting right atrial pressure of 3 mmHg. IAS/Shunts: No atrial level shunt detected by color flow Doppler.  LEFT VENTRICLE PLAX 2D LVIDd:         6.10 cm   Diastology LVIDs:         3.70 cm   LV e' medial:    10.30 cm/s LV PW:         1.10 cm   LV E/e' medial:  9.7 LV IVS:        1.00 cm   LV e' lateral:   9.03 cm/s LVOT diam:     2.50 cm   LV E/e' lateral: 11.0 LV SV:         156 LV SV Index:   67 LVOT Area:     4.91 cm  RIGHT VENTRICLE             IVC RV S prime:     14.10 cm/s  IVC diam: 2.20 cm TAPSE (M-mode): 2.0 cm LEFT ATRIUM              Index        RIGHT ATRIUM           Index LA diam:        5.10 cm  2.19 cm/m   RA Area:     17.00 cm LA Vol (A2C):   106.0 ml 45.57 ml/m  RA Volume:   44.80 ml  19.26 ml/m LA Vol (A4C):   118.0 ml 50.73 ml/m LA Biplane Vol: 113.0 ml 48.58 ml/m  AORTIC VALVE LVOT Vmax:   135.00 cm/s LVOT Vmean:  86.700 cm/s LVOT VTI:    0.317 m  AORTA Ao Root diam: 3.80 cm Ao Asc diam:  4.00 cm MITRAL VALVE               TRICUSPID VALVE MV Area (PHT): 3.27 cm    TR Peak grad:   31.4 mmHg MV Decel Time: 232  msec    TR Vmax:        280.00 cm/s MV E velocity: 99.40 cm/s MV A velocity: 69.40 cm/s  SHUNTS MV E/A ratio:  1.43        Systemic VTI:  0.32 m                            Systemic Diam: 2.50 cm Olga MillersBrian Crenshaw MD Electronically signed by Olga MillersBrian Crenshaw MD Signature Date/Time: 04/12/2021/1:58:03 PM    Final     Microbiology: Results for orders placed or performed during the hospital encounter of 04/11/21  Resp Panel by RT-PCR (Flu A&B, Covid) Nasopharyngeal Swab     Status: None   Collection Time: 04/11/21 10:02 PM   Specimen: Nasopharyngeal Swab; Nasopharyngeal(NP) swabs in vial transport medium  Result Value Ref Range Status   SARS Coronavirus 2 by RT PCR NEGATIVE NEGATIVE Final    Comment: (NOTE) SARS-CoV-2 target nucleic acids are NOT DETECTED.  The SARS-CoV-2 RNA is generally detectable in upper respiratory specimens during the acute phase of infection. The lowest concentration of SARS-CoV-2 viral copies this assay can detect is 138 copies/mL. A negative result does not preclude SARS-Cov-2 infection and should not be used as the sole basis for treatment or other patient management decisions. A negative result may occur with  improper specimen collection/handling, submission of specimen other than nasopharyngeal swab, presence of viral mutation(s) within the areas targeted by this assay, and inadequate number of viral copies(<138 copies/mL). A negative result must be combined with clinical observations, patient history, and epidemiological information. The expected result is Negative.  Fact Sheet for Patients:  BloggerCourse.comhttps://www.fda.gov/media/152166/download  Fact Sheet for Healthcare Providers:  SeriousBroker.ithttps://www.fda.gov/media/152162/download  This test is no t yet approved or cleared by the [place] FDA and  has been authorized for detection and/or diagnosis of SARS-CoV-2 by FDA under an Emergency Use Authorization (EUA). This EUA will remain  in effect (meaning this test can be used)  for the duration of the COVID-19 declaration under Section 564(b)(1) of the Act, 21 U.S.C.section 360bbb-3(b)(1), unless the authorization is terminated  or revoked sooner.       Influenza A by PCR NEGATIVE NEGATIVE Final   Influenza B by PCR NEGATIVE NEGATIVE Final    Comment: (NOTE) The Xpert Xpress SARS-CoV-2/FLU/RSV plus assay is intended as an aid in the diagnosis of influenza from Nasopharyngeal swab specimens and should not be used as a sole basis for treatment. Nasal washings and aspirates are unacceptable for Xpert Xpress SARS-CoV-2/FLU/RSV testing.  Fact Sheet for Patients: BloggerCourse.comhttps://www.fda.gov/media/152166/download  Fact Sheet for Healthcare Providers: SeriousBroker.ithttps://www.fda.gov/media/152162/download  This test is not yet approved or cleared by the [place] FDA and has been authorized for detection and/or diagnosis of SARS-CoV-2 by FDA under an Emergency Use Authorization (EUA). This EUA will remain in effect (meaning this test can be used) for the duration of the COVID-19 declaration under  Section 564(b)(1) of the Act, 21 U.S.C. section 360bbb-3(b)(1), unless the authorization is terminated or revoked.  Performed at [facility], [address] [place], [place] [address]     Labs: CBC: Recent Labs  Lab 04/11/21 2201 04/12/21 0144  WBC 8.5 8.7  HGB 14.0 13.3  HCT 39.8 37.7*  MCV 91.1 90.8  PLT 138* 121*   Basic Metabolic Panel: Recent Labs  Lab 04/11/21 2201 04/12/21 0144 04/12/21 0336 04/12/21 1447  NA 133* 133*  --   --   K 3.4* 3.2*  --  3.3*  CL 97* 98  --   --   CO2 23 23  --   --   GLUCOSE 120* 115*  --   --   BUN 18 16  --   --   CREATININE 0.91 0.85  --   --   CALCIUM 8.7* 8.6*  --   --   MG 1.5*  --  1.9 2.0   Liver Function Tests: Recent Labs  Lab 04/11/21 2201  AST 36  ALT 38  ALKPHOS 60  BILITOT 1.1  PROT 6.3*  ALBUMIN 3.4*   CBG: No results for input(s): GLUCAP in the last 168 hours.  Discharge time spent:  greater than 30 minutes.  Signed: Thad Rangeripudeep Masai Kidd, MD [facility] 04/12/2021

## 2021-04-12 NOTE — H&P (View-Only) (Signed)
°Cardiology Consultation:  ° °Patient ID: Ralph Goodman °MRN: [id number]; DOB: 09/02/1940 ° °Admit date: 04/11/2021 °Date of Consult: 04/12/2021 ° °PCP:  Keyserling, Thomas C, MD °  °[facility] Providers °Cardiologist: Keymoni Mccaster (remote){ °  ° ° °Patient Profile:  ° °Ralph Goodman is a 81 y.o. male with a hx of PVC's and  who is being seen 04/12/2021 for the evaluation of 2:1 AV block at the request of Dr. McDowell. ° °History of Present Illness:  ° °Mr. Ralph Goodman is well known to me remotely. I saw him in consultation in 2008. 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 He did not feel the PVC's. He also had developed problems with venous insufficiency and sleep apnea. He was in his usual state of health yesterday when he noted a HR of 40 and presented for additional eval. 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 He denies syncope. No chest pain or sob. He may have a little less energy than normal.  ° ° °PMH °Sleep apnea °RBBB °HTN °Chronic venous insufficiency ° °No past surgical history on file.  ° °Home Medications:  °Prior to Admission medications   °Medication Sig Start Date End Date Taking? Authorizing Provider  °acyclovir (ZOVIRAX) 400 MG tablet Take 400 mg by mouth daily. 05/21/16   [provider]  °albuterol (PROAIR HFA) 108 (90 Base) MCG/ACT inhaler Inhale 2 puffs every 6 hours as needed- rescue 09/17/17   Young, Clinton D, MD  °amLODipine (NORVASC) 5 MG tablet Take 5 mg by mouth daily. 04/20/16   [provider]  °atorvastatin (LIPITOR) 10 MG tablet Take 10 mg by mouth daily. 05/17/16   [provider]  °Budeson-Glycopyrrol-Formoterol (BREZTRI AEROSPHERE) 160-9-4.8 MCG/ACT AERO Inhale 2 puffs into the lungs in the morning and at bedtime. 06/21/19   Young, Clinton D, MD  °chlorthalidone (HYGROTON) 25 MG tablet Take 25 mg by mouth daily.  07/20/19   [provider]  °eplerenone (INSPRA) 25 MG tablet Take 25 mg by mouth 2 (two) times daily. 12/04/15    [provider]  °irbesartan (AVAPRO) 150 MG tablet Take 1 tablet by mouth 2 (two) times daily. 05/27/16   [provider]  °montelukast (SINGULAIR) 10 MG tablet Take 10 mg by mouth daily. 08/26/13   [provider]  °pantoprazole (PROTONIX) 40 MG tablet Take 40 mg by mouth 2 (two) times daily. 04/14/16   [provider]  ° ° °Inpatient Medications: °Scheduled Meds: ° enoxaparin (LOVENOX) injection  40 mg Subcutaneous Q24H  ° feeding supplement  237 mL Oral BID BM  ° melatonin  3 mg Oral QHS  ° zolpidem  5 mg Oral QHS  ° °Continuous Infusions: ° 0.9 % NaCl with KCl 20 mEq / L 100 mL/hr at 04/12/21 0129  ° °PRN Meds: °acetaminophen **OR** acetaminophen, atropine, LORazepam, magnesium hydroxide, ondansetron **OR** ondansetron (ZOFRAN) IV, temazepam ° °Allergies:   No Known Allergies ° °Social History:   °Social History  ° °Socioeconomic History  ° Marital status: Married  °  Spouse name: Not on file  ° Number of children: Not on file  ° Years of education: Not on file  ° Highest education level: Not on file  °Occupational History  ° Not on file  °Tobacco Use  ° Smoking status: Never  ° Smokeless tobacco: Never  °Substance and Sexual Activity  ° Alcohol use: Yes  °  Alcohol/week: 1.0 - 2.0 standard drink  °  Types: 1 -   2 Glasses of wine per week  ° Drug use: Not on file  ° Sexual activity: Not on file  °Other Topics Concern  ° Not on file  °Social History Narrative  ° Not on file  ° °Social Determinants of Health  ° °Financial Resource Strain: Not on file  °Food Insecurity: Not on file  °Transportation Needs: Not on file  °Physical Activity: Not on file  °Stress: Not on file  °Social Connections: Not on file  °Intimate Partner Violence: Not on file  °  °Family History:   °Father with PPM at 67 due to [place] attacks °No family history on file.  ° °ROS:  °Please see the history of present illness.  ° °All other ROS reviewed and negative.    ° °Physical Exam/Data:  ° °Vitals:  °  04/12/21 0421 04/12/21 0800 04/12/21 0831 04/12/21 0834  °BP: (!) 149/69  (!) 153/62   °Pulse: 71 64 (!) 57   °Resp: 17 16 15   °Temp: 98 °F (36.7 °C)  97.6 °F (36.4 °C) 97.6 °F (36.4 °C)  °TempSrc: Oral  Oral   °SpO2: 99% 100% 99%   ° °No intake or output data in the 24 hours ending 04/12/21 1250 °Last 3 Weights 10/03/2020 10/04/2019 09/19/2018  °Weight (lbs) 252 lb 265 lb 6.4 oz 270 lb 3.2 oz  °Weight (kg) 114.306 kg 120.385 kg 122.562 kg  °   °There is no height or weight on file to calculate BMI.  °General:  Well nourished, well developed, in no acute distress °HEENT: normal °Neck: no JVD °Vascular: No carotid bruits; Distal pulses 2+ bilaterally °Cardiac:  normal S1, S2; IRRR; no murmur split S2. °Lungs:  clear to auscultation bilaterally, no wheezing, rhonchi or rales  °Abd: soft, obese, nontender, no hepatomegaly  °Ext: 1+ woody °Musculoskeletal:  No deformities, BUE and BLE strength normal and equal °Skin: warm and dry  °Neuro:  CNs 2-12 intact, no focal abnormalities noted °Psych:  Normal affect  ° °EKG:  The EKG was personally reviewed and demonstrates:  NSR with RBBB and 2:1 AV block °Telemetry:  Telemetry was personally reviewed and demonstrates:  NSR with 2:1 AV block, AVWB, and first degree AV block ° °Relevant CV Studies: °2D echo is pending ° °Laboratory Data: ° °High Sensitivity Troponin:   °Recent Labs  °Lab 04/11/21 °2201 04/12/21 °0001 04/12/21 °0144 04/12/21 °0336  °TROPONINIHS 30* 31* 36* 52*  °   °Chemistry °Recent Labs  °Lab 04/11/21 °2201 04/12/21 °0144 04/12/21 °0336  °NA 133* 133*  --   °K 3.4* 3.2*  --   °CL 97* 98  --   °CO2 23 23  --   °GLUCOSE 120* 115*  --   °BUN 18 16  --   °CREATININE 0.91 0.85  --   °CALCIUM 8.7* 8.6*  --   °MG 1.5*  --  1.9  °GFRNONAA >60 >60  --   °ANIONGAP 13 12  --   °  °Recent Labs  °Lab 04/11/21 °2201  °PROT 6.3*  °ALBUMIN 3.4*  °AST 36  °ALT 38  °ALKPHOS 60  °BILITOT 1.1  ° °Lipids No results for input(s): CHOL, TRIG, HDL, LABVLDL, LDLCALC, CHOLHDL in the last  168 hours.  °Hematology °Recent Labs  °Lab 04/11/21 °2201 04/12/21 °0144  °WBC 8.5 8.7  °RBC 4.37 4.15*  °HGB 14.0 13.3  °HCT 39.8 37.7*  °MCV 91.1 90.8  °MCH 32.0 32.0  °MCHC 35.2 35.3  °RDW 12.8 12.8  °PLT 138* 121*  ° °Thyroid   No results for input(s): TSH, FREET4 in the last 168 hours.  °BNP °Recent Labs  °Lab 04/12/21 °0144  °BNP 97.8  °  °DDimer  °Recent Labs  °Lab 04/11/21 °2201  °DDIMER 0.62*  ° ° ° °Radiology/Studies:  °DG Chest Portable 1 View ° °Result Date: 04/11/2021 °CLINICAL DATA:  Shortness of breath EXAM: PORTABLE CHEST 1 VIEW COMPARISON:  06/08/2019 FINDINGS: Cardiomegaly. Chronic scarring and pleural thickening at the right lung base, stable. Left lung clear. No acute infiltrates. No acute bony abnormality. IMPRESSION: Stable chronic changes at the right lung base. Cardiomegaly. No active disease. Electronically Signed   By: Kevin  Dover M.D.   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 He agrees. He has not had syncope and would like to go home and return on Wednesday for PPM insertion. I will arrange. °2. Electrolyte abnormality - he has had potassium and magnesium repletion and can go home. Reasonable to check his labs this afternoon before discharge.  °3. Sleep apnea - continue CPAP. °4. HTN - his bp is a little elevated. Once PPM is in place we can consider adding a beta blocker. °5. Disp. - ok to discharge home later today. My office will contact him to arrange for PPM insertion as an outpatient.  ° °For questions or updates, please contact [facility] °Please consult [email or web address] for contact info under  ° ° °Signed, °Kiaya Haliburton, MD  °04/12/2021 12:50 PM  °

## 2021-04-12 NOTE — Consult Note (Signed)
Cardiology Consultation:   Patient ID: Ralph Goodman MRN: [id number]; DOB: [birth year]  Admit date: 04/11/2021 Date of Consult: 04/12/2021  PCP:  Juline Patch, MD   [facility] Providers Cardiologist: Lovena Le (remote){     Patient Profile:   Ralph Goodman is a 81 y.o. male with a hx of PVC's and  who is being seen 04/12/2021 for the evaluation of 2:1 AV block at the request of Dr. Domenic Polite.  History of Present Illness:   [name] is well known to me remotely. I saw him in consultation in 2008. 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 He did not feel the PVC's. He also had developed problems with venous insufficiency and sleep apnea. He was in his usual state of health yesterday when he noted a HR of 40 and presented for additional eval. 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 He denies syncope. No chest pain or sob. He may have a little less energy than normal.    PMH Sleep apnea RBBB HTN Chronic venous insufficiency  No past surgical history on file.   Home Medications:  Prior to Admission medications   Medication Sig Start Date End Date Taking? Authorizing Provider  acyclovir (ZOVIRAX) 400 MG tablet Take 400 mg by mouth daily. 05/21/16   [provider]  albuterol (PROAIR HFA) 108 (90 Base) MCG/ACT inhaler Inhale 2 puffs every 6 hours as needed- rescue 09/17/17   Baird Lyons D, MD  amLODipine (NORVASC) 5 MG tablet Take 5 mg by mouth daily. 04/20/16   [provider]  atorvastatin (LIPITOR) 10 MG tablet Take 10 mg by mouth daily. 05/17/16   [provider]  Budeson-Glycopyrrol-Formoterol (BREZTRI AEROSPHERE) 160-9-4.8 MCG/ACT AERO Inhale 2 puffs into the lungs in the morning and at bedtime. 06/21/19   Baird Lyons D, MD  chlorthalidone (HYGROTON) 25 MG tablet Take 25 mg by mouth daily.  07/20/19   [provider]  eplerenone (INSPRA) 25 MG tablet Take 25 mg by mouth 2 (two) times daily. 12/04/15    [provider]  irbesartan (AVAPRO) 150 MG tablet Take 1 tablet by mouth 2 (two) times daily. 05/27/16   [provider]  montelukast (SINGULAIR) 10 MG tablet Take 10 mg by mouth daily. 08/26/13   [provider]  pantoprazole (PROTONIX) 40 MG tablet Take 40 mg by mouth 2 (two) times daily. 04/14/16   [provider]    Inpatient Medications: Scheduled Meds:  enoxaparin (LOVENOX) injection  40 mg Subcutaneous Q24H   feeding supplement  237 mL Oral BID BM   melatonin  3 mg Oral QHS   zolpidem  5 mg Oral QHS   Continuous Infusions:  0.9 % NaCl with KCl 20 mEq / L 100 mL/hr at 04/12/21 0129   PRN Meds: acetaminophen **OR** acetaminophen, atropine, LORazepam, magnesium hydroxide, ondansetron **OR** ondansetron (ZOFRAN) IV, temazepam  Allergies:   No Known Allergies  Social History:   Social History   Socioeconomic History   Marital status: Married    Spouse name: Not on file   Number of children: Not on file   Years of education: Not on file   Highest education level: Not on file  Occupational History   Not on file  Tobacco Use   Smoking status: Never   Smokeless tobacco: Never  Substance and Sexual Activity   Alcohol use: Yes    Alcohol/week: 1.0 - 2.0 standard drink    Types: 1 -  2 Glasses of wine per week   Drug use: Not on file   Sexual activity: Not on file  Other Topics Concern   Not on file  Social History Narrative   Not on file   Social Determinants of Health   Financial Resource Strain: Not on file  Food Insecurity: Not on file  Transportation Needs: Not on file  Physical Activity: Not on file  Stress: Not on file  Social Connections: Not on file  Intimate Partner Violence: Not on file    Family History:   Father with PPM at 71 due to [place] attacks No family history on file.   ROS:  Please see the history of present illness.   All other ROS reviewed and negative.     Physical Exam/Data:   Vitals:    04/12/21 0421 04/12/21 0800 04/12/21 0831 04/12/21 0834  BP: (!) 149/69  (!) 153/62   Pulse: 71 64 (!) 57   Resp: 17 16 15    Temp: 98 F (36.7 C)  97.6 F (36.4 C) 97.6 F (36.4 C)  TempSrc: Oral  Oral   SpO2: 99% 100% 99%    No intake or output data in the 24 hours ending 04/12/21 1250 Last 3 Weights 10/03/2020 10/04/2019 09/19/2018  Weight (lbs) 252 lb 265 lb 6.4 oz 270 lb 3.2 oz  Weight (kg) 114.306 kg 120.385 kg 122.562 kg     There is no height or weight on file to calculate BMI.  General:  Well nourished, well developed, in no acute distress HEENT: normal Neck: no JVD Vascular: No carotid bruits; Distal pulses 2+ bilaterally Cardiac:  normal S1, S2; IRRR; no murmur split S2. Lungs:  clear to auscultation bilaterally, no wheezing, rhonchi or rales  Abd: soft, obese, nontender, no hepatomegaly  Ext: 1+ woody Musculoskeletal:  No deformities, BUE and BLE strength normal and equal Skin: warm and dry  Neuro:  CNs 2-12 intact, no focal abnormalities noted Psych:  Normal affect   EKG:  The EKG was personally reviewed and demonstrates:  NSR with RBBB and 2:1 AV block Telemetry:  Telemetry was personally reviewed and demonstrates:  NSR with 2:1 AV block, AVWB, and first degree AV block  Relevant CV Studies: 2D echo is pending  Laboratory Data:  High Sensitivity Troponin:   Recent Labs  Lab 04/11/21 2201 04/12/21 0001 04/12/21 0144 04/12/21 0336  TROPONINIHS 30* 31* 36* 52*     Chemistry Recent Labs  Lab 04/11/21 2201 04/12/21 0144 04/12/21 0336  NA 133* 133*  --   K 3.4* 3.2*  --   CL 97* 98  --   CO2 23 23  --   GLUCOSE 120* 115*  --   BUN 18 16  --   CREATININE 0.91 0.85  --   CALCIUM 8.7* 8.6*  --   MG 1.5*  --  1.9  GFRNONAA >60 >60  --   ANIONGAP 13 12  --     Recent Labs  Lab 04/11/21 2201  PROT 6.3*  ALBUMIN 3.4*  AST 36  ALT 38  ALKPHOS 60  BILITOT 1.1   Lipids No results for input(s): CHOL, TRIG, HDL, LABVLDL, LDLCALC, CHOLHDL in the last  168 hours.  Hematology Recent Labs  Lab 04/11/21 2201 04/12/21 0144  WBC 8.5 8.7  RBC 4.37 4.15*  HGB 14.0 13.3  HCT 39.8 37.7*  MCV 91.1 90.8  MCH 32.0 32.0  MCHC 35.2 35.3  RDW 12.8 12.8  PLT 138* 121*   Thyroid  No results for input(s): TSH, FREET4 in the last 168 hours.  BNP Recent Labs  Lab 04/12/21 0144  BNP 97.8    DDimer  Recent Labs  Lab 04/11/21 2201  DDIMER 0.62*     Radiology/Studies:  DG Chest Portable 1 View  Result Date: 04/11/2021 CLINICAL DATA:  Shortness of breath EXAM: PORTABLE CHEST 1 VIEW COMPARISON:  06/08/2019 FINDINGS: Cardiomegaly. Chronic scarring and pleural thickening at the right lung base, stable. Left lung clear. No acute infiltrates. No acute bony abnormality. IMPRESSION: Stable chronic changes at the right lung base. Cardiomegaly. No active disease. Electronically Signed   By: Rolm Baptise M.D.   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 He agrees. He has not had syncope and would like to go home and return on Wednesday for PPM insertion. I will arrange. 2. Electrolyte abnormality - he has had potassium and magnesium repletion and can go home. Reasonable to check his labs this afternoon before discharge.  3. Sleep apnea - continue CPAP. 4. HTN - his bp is a little elevated. Once PPM is in place we can consider adding a beta blocker. 5. Disp. - ok to discharge home later today. My office will contact him to arrange for PPM insertion as an outpatient.   For questions or updates, please contact [place] Please consult [email or web address] for contact info under    Signed, Cristopher Peru, MD  04/12/2021 12:50 PM

## 2021-04-12 NOTE — Evaluation (Signed)
Physical Therapy Evaluation Patient Details Name: Ralph Goodman MRN: [id number] DOB: 07/02/1940 Today's Date: 04/12/2021  History of Present Illness  81 y.o. male presents to [facility] hospital on 04/11/2021 with fatigue. PT noted to be bradycardic in ED. PMH includes HTN, dyslipidemia, OA, GERD, OSA, asthma, RBBB.  Clinical Impression  Pt presents to PT with deficits in balance, gait, power, endurance. Pt reports a recent history of imbalance, utilizing UE support of furniture/walls and having difficulty with quick changes of direction. Pt does show increased drift and utilizes UE support of furniture on eval. Pt will benefit from continued gait and balance training in an effort to reduce falls risk. PT recommends the pt receive a cane at the time of discharge to aide in improving stability.       Recommendations for follow up therapy are one component of a multi-disciplinary discharge planning process, led by the attending physician.  Recommendations may be updated based on patient status, additional functional criteria and insurance authorization.  Follow Up Recommendations No PT follow up    Assistance Recommended at Discharge PRN  Patient can return home with the following  Assistance with cooking/housework;Help with stairs or ramp for entrance    Equipment Recommendations Cane  Recommendations for Other Services       Functional Status Assessment Patient has had a recent decline in their functional status and demonstrates the ability to make significant improvements in function in a reasonable and predictable amount of time.     Precautions / Restrictions Precautions Precautions: Fall Restrictions Weight Bearing Restrictions: No      Mobility  Bed Mobility Overal bed mobility: Independent                  Transfers Overall transfer level: Independent                      Ambulation/Gait Ambulation/Gait assistance: Supervision Gait Distance (Feet): 120  Feet Assistive device:  (PRN UE support of furniture or walls) Gait Pattern/deviations: Step-through pattern, Drifts right/left Gait velocity: functional Gait velocity interpretation: 1.31 - 2.62 ft/sec, indicative of limited community ambulator (likely limited by cluttered room)   General Gait Details: pt with increased lateral drift, appears to walk in the direction of the nearest UE support available  Stairs            Wheelchair Mobility    Modified Rankin (Stroke Patients Only)       Balance Overall balance assessment: Needs assistance Sitting-balance support: No upper extremity supported, Feet supported Sitting balance-Leahy Scale: Good     Standing balance support: Single extremity supported Standing balance-Leahy Scale: Poor                               Pertinent Vitals/Pain Pain Assessment Pain Assessment: No/denies pain    Home Living Family/patient expects to be discharged to:: Private residence Living Arrangements: Spouse/significant other Available Help at Discharge: Family;Available 24 hours/day Type of Home: House Home Access: Stairs to enter Entrance Stairs-Rails: Left Entrance Stairs-Number of Steps: 6 Alternate Level Stairs-Number of Steps: flight Home Layout: Two level Home Equipment: Agricultural consultant (2 wheels);Crutches;Grab bars - tub/shower      Prior Function Prior Level of Function : Independent/Modified Independent             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 Reports furniture surfing, imbalance with turns recently       Hand Dominance  Extremity/Trunk Assessment   Upper Extremity Assessment Upper Extremity Assessment: Overall WFL for tasks assessed    Lower Extremity Assessment Lower Extremity Assessment: Overall WFL for tasks assessed    Cervical / Trunk Assessment Cervical / Trunk Assessment: Normal  Communication   Communication: No difficulties  Cognition  Arousal/Alertness: Awake/alert Behavior During Therapy: WFL for tasks assessed/performed Overall Cognitive Status: Within Functional Limits for tasks assessed                                          General Comments General comments (skin integrity, edema, etc.): pt with brief periods of tachycardia on monitor up to 153, PT believes this to be motion related artifact. sats stable on room air    Exercises     Assessment/Plan    PT Assessment Patient needs continued PT services  PT Problem List Decreased balance;Decreased activity tolerance;Decreased mobility;Decreased knowledge of use of DME;Cardiopulmonary status limiting activity       PT Treatment Interventions DME instruction;Gait training;Stair training;Functional mobility training;Therapeutic activities;Balance training;Neuromuscular re-education;Patient/family education    PT Goals (Current goals can be found in the Care Plan section)  Acute Rehab PT Goals Patient Stated Goal: to improve balance PT Goal Formulation: With patient Time For Goal Achievement: 04/26/21 Potential to Achieve Goals: Good Additional Goals Additional Goal #1: Pt will score  >19/24 on the DGI, with use of a cane, to indicate a reduced risk for falls    Frequency Min 3X/week     Co-evaluation               AM-PAC PT "6 Clicks" Mobility  Outcome Measure Help needed turning from your back to your side while in a flat bed without using bedrails?: None Help needed moving from lying on your back to sitting on the side of a flat bed without using bedrails?: None Help needed moving to and from a bed to a chair (including a wheelchair)?: None Help needed standing up from a chair using your arms (e.g., wheelchair or bedside chair)?: None Help needed to walk in hospital room?: A Little Help needed climbing 3-5 steps with a railing? : A Little 6 Click Score: 22    End of Session   Activity Tolerance: Patient tolerated treatment  well Patient left: in bed;with call bell/phone within reach Nurse Communication: Mobility status PT Visit Diagnosis: Other abnormalities of gait and mobility (R26.89);Unsteadiness on feet (R26.81)    Time: 0737-1062 PT Time Calculation (min) (ACUTE ONLY): 15 min   Charges:   PT Evaluation $PT Eval Low Complexity: 1 Low          Arlyss Gandy, PT, DPT Acute Rehabilitation Pager: [phone] Office [phone]   Arlyss Gandy 04/12/2021, 12:21 PM

## 2021-04-14 ENCOUNTER — Telehealth: Payer: Self-pay

## 2021-04-14 NOTE — Telephone Encounter (Signed)
Pt had some questions regarding parking.  All questions answered.

## 2021-04-14 NOTE — Telephone Encounter (Signed)
Outreach made to Pt.  Pt scheduled for pacemaker implant on April 16, 2021 at 2:30 pm.  Lab work up to date.  Pt's wife will come to office today to pick up instruction letter and soap.  Work up complete

## 2021-04-14 NOTE — Telephone Encounter (Signed)
Patient is calling requesting to speak with RN Victorino Dike.

## 2021-04-15 NOTE — Pre-Procedure Instructions (Signed)
Instructed patient on the following items: Arrival time 1130 Nothing to eat or drink after midnight No meds AM of procedure Responsible person to drive you home and stay with you for 24 hrs Wash with special soap night before and morning of procedure  

## 2021-04-16 ENCOUNTER — Encounter: Payer: Self-pay | Admitting: Internal Medicine

## 2021-04-16 ENCOUNTER — Other Ambulatory Visit: Payer: Self-pay

## 2021-04-16 ENCOUNTER — Encounter (HOSPITAL_COMMUNITY)
Admission: RE | Disposition: A | Discharge status: Home / Self Care | Payer: Self-pay | Source: Home / Self Care | Attending: Internal Medicine

## 2021-04-16 ENCOUNTER — Ambulatory Visit (HOSPITAL_COMMUNITY)
Admission: RE | Admit: 2021-04-16 | Discharge: 2021-04-16 | Disposition: A | Discharge status: Home / Self Care | Payer: Medicare PPO | Attending: Internal Medicine | Admitting: Internal Medicine

## 2021-04-16 ENCOUNTER — Ambulatory Visit (HOSPITAL_COMMUNITY): Payer: Medicare PPO

## 2021-04-16 DIAGNOSIS — I441 Atrioventricular block, second degree: Secondary | ICD-10-CM | POA: Insufficient documentation

## 2021-04-16 DIAGNOSIS — I872 Venous insufficiency (chronic) (peripheral): Secondary | ICD-10-CM | POA: Insufficient documentation

## 2021-04-16 DIAGNOSIS — Z950 Presence of cardiac pacemaker: Secondary | ICD-10-CM

## 2021-04-16 DIAGNOSIS — G4730 Sleep apnea, unspecified: Secondary | ICD-10-CM | POA: Diagnosis not present

## 2021-04-16 DIAGNOSIS — I10 Essential (primary) hypertension: Secondary | ICD-10-CM | POA: Insufficient documentation

## 2021-04-16 DIAGNOSIS — R001 Bradycardia, unspecified: Secondary | ICD-10-CM | POA: Diagnosis not present

## 2021-04-16 DIAGNOSIS — I4510 Unspecified right bundle-branch block: Secondary | ICD-10-CM | POA: Diagnosis not present

## 2021-04-16 HISTORY — PX: PACEMAKER IMPLANT: EP1218

## 2021-04-16 LAB — BASIC METABOLIC PANEL
Anion gap: 10 (ref 5–15)
BUN: 8 mg/dL (ref 8–23)
CO2: 26 mmol/L (ref 22–32)
Calcium: 9.1 mg/dL (ref 8.9–10.3)
Chloride: 99 mmol/L (ref 98–111)
Creatinine, Ser: 0.72 mg/dL (ref 0.61–1.24)
GFR, Estimated: >60 mL/min (ref >60)
Glucose, Bld: 101 mg/dL — ABNORMAL HIGH (ref 70–99)
Potassium: 3.4 mmol/L — ABNORMAL LOW (ref 3.5–5.1)
Sodium: 135 mmol/L (ref 135–145)

## 2021-04-16 SURGERY — PACEMAKER IMPLANT
Anesthesia: LOCAL

## 2021-04-16 MED ORDER — CHLORHEXIDINE GLUCONATE 4 % EX LIQD: 4.0000 "application " | Freq: Once | CUTANEOUS | Status: DC

## 2021-04-16 MED ORDER — HEPARIN (PORCINE) IN NACL 1000-0.9 UT/500ML-% IV SOLN
INTRAVENOUS | Status: DC | PRN
  Administered 2021-04-16: 500 mL

## 2021-04-16 MED ORDER — ACETAMINOPHEN 325 MG PO TABS: 325.0000 mg | ORAL_TABLET | ORAL | Status: DC | PRN

## 2021-04-16 MED ORDER — FENTANYL CITRATE (PF) 100 MCG/2ML IJ SOLN
INTRAMUSCULAR | Status: DC | PRN
Start: 2021-04-16 — End: 2021-04-16
  Administered 2021-04-16: 12.5 ug via INTRAVENOUS
  Administered 2021-04-16 (×2): 25 ug via INTRAVENOUS
  Administered 2021-04-16: 12.5 ug via INTRAVENOUS

## 2021-04-16 MED ORDER — FENTANYL CITRATE (PF) 100 MCG/2ML IJ SOLN
INTRAMUSCULAR | Status: AC
  Filled 2021-04-16: qty 2

## 2021-04-16 MED ORDER — MIDAZOLAM HCL 5 MG/5ML IJ SOLN
INTRAMUSCULAR | Status: AC
  Filled 2021-04-16: qty 5

## 2021-04-16 MED ORDER — LIDOCAINE HCL (PF) 1 % IJ SOLN
INTRAMUSCULAR | Status: DC | PRN
  Administered 2021-04-16: 50 mL

## 2021-04-16 MED ORDER — CEFAZOLIN SODIUM-DEXTROSE 1-4 GM/50ML-% IV SOLN
1.0000 g | Freq: Once | INTRAVENOUS | Status: AC
  Administered 2021-04-16: 1 g via INTRAVENOUS
  Filled 2021-04-16 (×2): qty 50

## 2021-04-16 MED ORDER — HEPARIN (PORCINE) IN NACL 1000-0.9 UT/500ML-% IV SOLN
INTRAVENOUS | Status: AC
  Filled 2021-04-16: qty 500

## 2021-04-16 MED ORDER — ONDANSETRON HCL 4 MG/2ML IJ SOLN: 4.0000 mg | Freq: Four times a day (QID) | INTRAMUSCULAR | Status: DC | PRN

## 2021-04-16 MED ORDER — CEFAZOLIN SODIUM-DEXTROSE 2-4 GM/100ML-% IV SOLN
2.0000 g | INTRAVENOUS | Status: AC
  Administered 2021-04-16: 2 g via INTRAVENOUS

## 2021-04-16 MED ORDER — GENTAMICIN SULFATE 40 MG/ML IJ SOLN
INTRAMUSCULAR | Status: AC
  Filled 2021-04-16: qty 2

## 2021-04-16 MED ORDER — CEFAZOLIN SODIUM-DEXTROSE 2-4 GM/100ML-% IV SOLN
INTRAVENOUS | Status: AC
  Filled 2021-04-16: qty 100

## 2021-04-16 MED ORDER — LIDOCAINE HCL (PF) 1 % IJ SOLN
INTRAMUSCULAR | Status: AC
  Filled 2021-04-16: qty 60

## 2021-04-16 MED ORDER — LIDOCAINE HCL 1 % IJ SOLN
INTRAMUSCULAR | Status: AC
  Filled 2021-04-16: qty 60

## 2021-04-16 MED ORDER — MIDAZOLAM HCL 5 MG/5ML IJ SOLN
INTRAMUSCULAR | Status: DC | PRN
  Administered 2021-04-16: 2 mg via INTRAVENOUS
  Administered 2021-04-16 (×2): 1 mg via INTRAVENOUS
  Administered 2021-04-16: 2 mg via INTRAVENOUS

## 2021-04-16 MED ORDER — SODIUM CHLORIDE 0.9 % IV SOLN: INTRAVENOUS | Status: DC

## 2021-04-16 MED ORDER — SODIUM CHLORIDE 0.9 % IV SOLN
80.0000 mg | INTRAVENOUS | Status: AC
  Administered 2021-04-16: 80 mg

## 2021-04-16 MED ORDER — POVIDONE-IODINE 10 % EX SWAB
2.0000 "application " | Freq: Once | CUTANEOUS | Status: AC
  Administered 2021-04-16: 2 via TOPICAL

## 2021-04-16 SURGICAL SUPPLY — 11 items
CABLE SURGICAL S-101-97-12 (CABLE) ×3 IMPLANT
KIT ACCESSORY SELECTRA FIX CVD (MISCELLANEOUS) ×1 IMPLANT
LEAD SELECTRA 3D-55-42 (CATHETERS) ×1 IMPLANT
LEAD SOLIA S PRO MRI 53 (Lead) ×1 IMPLANT
LEAD SOLIA S PRO MRI 60 (Lead) ×1 IMPLANT
PACEMAKER EDORA 8DR-T MRI (Pacemaker) ×1 IMPLANT
PAD DEFIB RADIO PHYSIO CONN (PAD) ×3 IMPLANT
SHEATH 7FR PRELUDE SNAP 13 (SHEATH) ×1 IMPLANT
SHEATH 9FR PRELUDE SNAP 13 (SHEATH) ×1 IMPLANT
TRAY PACEMAKER INSERTION (PACKS) ×3 IMPLANT
WIRE HI TORQ VERSACORE-J 145CM (WIRE) ×1 IMPLANT

## 2021-04-16 NOTE — Progress Notes (Signed)
Pt ambulated without difficulty or bleeding.   Discharged home with wife who will drive and stay with pt x 24 hrs .  Dr Ladona Ridgel approved discharge after getting chest x ray results.  Pt and wife shown how to apply sling which pt is wearing on (L) arm.  DDI.  ?

## 2021-04-16 NOTE — Discharge Instructions (Signed)
? ? ?  Supplemental Discharge Instructions for  ?Pacemaker/Defibrillator Patients ? ?Tomorrow, 04/17/21, send in a device transmission ? ?Activity ?No heavy lifting or vigorous activity with your left/right arm for 6 to 8 weeks.  Do not raise your left/right arm above your head for one week.  Gradually raise your affected arm as drawn below. ? ?        ?   04/21/21                      04/22/21                      04/23/21                    04/24/21 ?__ ? ?NO DRIVING for  1 week   ; you may begin driving on  02/17/73   . ? ?WOUND CARE ?Keep the wound area clean and dry.  Do not get this area wet , no showers for one week; you may shower on  04/24/21  . ?Tomorrow, 04/17/21, remove the arm sling ?Tomorrow, 04/17/21 remove the LARGE outer plastic bandage.  Underneath the plastic bandage there are steri strips (paper tapes), DO NOT remove these. ?The tape/steri-strips on your wound will fall off; do not pull them off.  No bandage is needed on the site.  DO  NOT apply any creams, oils, or ointments to the wound area. ?If you notice any drainage or discharge from the wound, any swelling or bruising at the site, or you develop a fever > 101? F after you are discharged home, call the office at once. ? ?Special Instructions ?You are still able to use cellular telephones; use the ear opposite the side where you have your pacemaker/defibrillator.  Avoid carrying your cellular phone near your device. ?When traveling through airports, show security personnel your identification card to avoid being screened in the metal detectors.  Ask the security personnel to use the hand wand. ?Avoid arc welding equipment, MRI testing (magnetic resonance imaging), TENS units (transcutaneous nerve stimulators).  Call the office for questions about other devices. ?Avoid electrical appliances that are in poor condition or are not properly grounded. ?Microwave ovens are safe to be near or to operate. ? ? ?

## 2021-04-16 NOTE — Interval H&P Note (Signed)
History and Physical Interval Note: ? ?04/16/2021 ?12:57 PM ? ?Ralph Goodman  has presented today for surgery, with the diagnosis of bradycardia.  The various methods of treatment have been discussed with the patient and family. After consideration of risks, benefits and other options for treatment, the patient has consented to  Procedure(s): ?PACEMAKER IMPLANT (N/A) as a surgical intervention.  The patient's history has been reviewed, patient examined, no change in status, stable for surgery.  I have reviewed the patient's chart and labs.  Questions were answered to the patient's satisfaction.   ? ? ?Ralph Goodman ? ? ?

## 2021-04-17 ENCOUNTER — Encounter (HOSPITAL_COMMUNITY): Payer: Self-pay | Admitting: Internal Medicine

## 2021-04-17 ENCOUNTER — Other Ambulatory Visit: Payer: Self-pay | Admitting: Student

## 2021-04-17 ENCOUNTER — Telehealth: Payer: Self-pay

## 2021-04-17 DIAGNOSIS — R001 Bradycardia, unspecified: Secondary | ICD-10-CM

## 2021-04-17 NOTE — Telephone Encounter (Signed)
-----   Message from Sheilah Pigeon, [place] sent at 04/16/2021  3:34 PM EST ----- ?Same day d/c today ? ?Biotronik PPM ? ?GT ? ?

## 2021-04-17 NOTE — Telephone Encounter (Signed)
Follow-up after same day discharge: ?Implant date: 04/16/2021 ?MD: Lewayne Bunting, MD ?Device: Biotronik [phone] [place] [id number] DR-T ?Location: Left Chest ? ? ?Wound check visit: 05/01/2021 @ 2:00 PM ?90 day MD follow-up: 07/30/2021 @ 12:15 PM ? ?Remote Transmission received:Yes ? ?Dressing removed: Yes  ? ?Patient advised me that he would remote the steri-strips himself on 04/26/2021. Stated he cannot wait 15 days to wait and shower. Patient advised we do not recommend him removing the ster-strips himself due to possibility of wound not completely healed. Stress importance this should be done in the office by the device clinic staff. Advised to call with further questions or concerns.   ?

## 2021-04-30 ENCOUNTER — Ambulatory Visit: Payer: Medicare PPO

## 2021-05-01 ENCOUNTER — Ambulatory Visit: Payer: Medicare PPO

## 2021-05-01 ENCOUNTER — Other Ambulatory Visit: Payer: Self-pay

## 2021-05-01 LAB — CUP PACEART INCLINIC DEVICE CHECK
Date Time Interrogation Session: 20230316164726
Implantable Lead Implant Date: 20230301
Implantable Lead Implant Date: 20230301
Implantable Lead Location: 753859
Implantable Lead Location: 753860
Implantable Lead Model: 377171
Implantable Lead Model: 377171
Implantable Lead Serial Number: 8000691836
Implantable Lead Serial Number: 8000717006
Implantable Pulse Generator Implant Date: 20230301
Lead Channel Impedance Value: 507 Ohm
Lead Channel Impedance Value: 585 Ohm
Lead Channel Pacing Threshold Amplitude: 0.6 V
Lead Channel Pacing Threshold Amplitude: 0.6 V
Lead Channel Pacing Threshold Amplitude: 0.6 V
Lead Channel Pacing Threshold Amplitude: 1.4 V
Lead Channel Pacing Threshold Amplitude: 1.4 V
Lead Channel Pacing Threshold Amplitude: 1.4 V
Lead Channel Pacing Threshold Pulse Width: 0.4 ms
Lead Channel Pacing Threshold Pulse Width: 0.4 ms
Lead Channel Pacing Threshold Pulse Width: 0.4 ms
Lead Channel Pacing Threshold Pulse Width: 0.4 ms
Lead Channel Pacing Threshold Pulse Width: 0.4 ms
Lead Channel Pacing Threshold Pulse Width: 0.4 ms
Lead Channel Sensing Intrinsic Amplitude: 1.4 mV
Lead Channel Sensing Intrinsic Amplitude: 1.5 mV
Lead Channel Sensing Intrinsic Amplitude: 16 mV
Lead Channel Sensing Intrinsic Amplitude: 16.1 mV
Lead Channel Setting Pacing Amplitude: 3 V
Lead Channel Setting Pacing Amplitude: 3 V
Lead Channel Setting Pacing Pulse Width: 0.4 ms
Pulse Gen Model: 407145
Pulse Gen Serial Number: 70324575

## 2021-05-01 NOTE — Progress Notes (Signed)
Wound check appointment. Steri-strips removed. Wound without redness or edema. Incision edges approximated, wound well healed. Normal device function. Thresholds, sensing, and impedances consistent with implant measurements. Device programmed at 3.0V/auto capture programmed on for extra safety margin until 3 month visit. Histogram distribution appropriate for patient and level of activity. No mode switches. 1 HVR noted with duration 6 seconds. Patient educated about wound care, arm mobility, lifting restrictions. ROV in 3 months with implanting physician. ?

## 2021-05-01 NOTE — Patient Instructions (Signed)

## 2021-07-16 ENCOUNTER — Ambulatory Visit (INDEPENDENT_AMBULATORY_CARE_PROVIDER_SITE_OTHER): Payer: Medicare PPO

## 2021-07-16 DIAGNOSIS — I4510 Unspecified right bundle-branch block: Secondary | ICD-10-CM | POA: Diagnosis not present

## 2021-07-17 LAB — CUP PACEART REMOTE DEVICE CHECK
Date Time Interrogation Session: 20230530122455
Implantable Lead Implant Date: 20230301
Implantable Lead Implant Date: 20230301
Implantable Lead Location: 753859
Implantable Lead Location: 753860
Implantable Lead Model: 377171
Implantable Lead Model: 377171
Implantable Lead Serial Number: 8000691836
Implantable Lead Serial Number: 8000717006
Implantable Pulse Generator Implant Date: 20230301
Pulse Gen Model: 407145
Pulse Gen Serial Number: 70324575

## 2021-07-30 ENCOUNTER — Encounter: Payer: Medicare PPO | Admitting: Internal Medicine

## 2021-07-30 NOTE — Progress Notes (Signed)
Remote pacemaker transmission.   

## 2021-08-22 ENCOUNTER — Ambulatory Visit (INDEPENDENT_AMBULATORY_CARE_PROVIDER_SITE_OTHER): Payer: Medicare PPO | Admitting: Internal Medicine

## 2021-08-22 VITALS — BP 116/64 | HR 74 | Ht 71.0 in | Wt 249.0 lb

## 2021-08-22 DIAGNOSIS — I4510 Unspecified right bundle-branch block: Secondary | ICD-10-CM | POA: Diagnosis not present

## 2021-08-22 DIAGNOSIS — R001 Bradycardia, unspecified: Secondary | ICD-10-CM

## 2021-08-22 DIAGNOSIS — Z950 Presence of cardiac pacemaker: Secondary | ICD-10-CM

## 2021-08-22 NOTE — Patient Instructions (Signed)
Medication Instructions:  Your physician recommends that you continue on your current medications as directed. Please refer to the Current Medication list given to you today.  *If you need a refill on your cardiac medications before your next appointment, please call your pharmacy*  Lab Work: None ordered.  If you have labs (blood work) drawn today and your tests are completely normal, you will receive your results only by: MyChart Message (if you have MyChart) OR A paper copy in the mail If you have any lab test that is abnormal or we need to change your treatment, we will call you to review the results.  Testing/Procedures: None ordered.  Follow-Up: At [facility], you and your health needs are our priority.  As part of our continuing mission to provide you with exceptional heart care, we have created designated Provider Care Teams.  These Care Teams include your primary Cardiologist (physician) and Advanced Practice Providers (APPs -  Physician Assistants and Nurse Practitioners) who all work together to provide you with the care you need, when you need it.  We recommend signing up for the patient portal called "MyChart".  Sign up information is provided on this After Visit Summary.  MyChart is used to connect with patients for Virtual Visits (Telemedicine).  Patients are able to view lab/test results, encounter notes, upcoming appointments, etc.  Non-urgent messages can be sent to your provider as well.   To learn more about what you can do with MyChart, go to [email or web address].    Your next appointment:   1 year(s)  The format for your next appointment:   In Person  Provider:   Lewayne Bunting, MD{or one of the following Advanced Practice Providers on your designated Care Team:   Francis Dowse, [place] Casimiro Needle "Mardelle Matte" Lanna Poche, [place]  Remote monitoring is used to monitor your Pacemaker from home. This monitoring reduces the number of office visits required to check your device to  one time per year. It allows [place] to keep an eye on the functioning of your device to ensure it is working properly. You are scheduled for a device check from home on 10/15/21. You may send your transmission at any time that day. If you have a wireless device, the transmission will be sent automatically. After your physician reviews your transmission, you will receive a postcard with your next transmission date.  Important Information About Sugar

## 2021-08-22 NOTE — Progress Notes (Signed)
HPI Ralph Goodman returns today for followup. 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 He also has chronic venous insufficiency. He has HTN and remote pvc's. He also has sleep apnea and has been on CPAP. He feels better since his PPM was placed. He denies chest pain or sob. His peripheral edema is improved.  No Known Allergies   Current Outpatient Medications  Medication Sig Dispense Refill   acyclovir ointment (ZOVIRAX) 5 % Apply 1 application topically as needed (outbreaks).     albuterol (PROAIR HFA) 108 (90 Base) MCG/ACT inhaler Inhale 2 puffs every 6 hours as needed- rescue (Patient taking differently: Inhale 2 puffs into the lungs in the morning and at bedtime.) 1 Inhaler 12   amLODipine (NORVASC) 5 MG tablet Take 5 mg by mouth in the morning.  12   atorvastatin (LIPITOR) 10 MG tablet Take 10 mg by mouth in the morning.  3   Budeson-Glycopyrrol-Formoterol (BREZTRI AEROSPHERE) 160-9-4.8 MCG/ACT AERO Inhale 2 puffs into the lungs in the morning and at bedtime. 10.7 g 6   doxazosin (CARDURA) 4 MG tablet Take 4 mg by mouth at bedtime.     eplerenone (INSPRA) 25 MG tablet Take 25 mg by mouth 2 (two) times daily.     fluticasone (FLONASE) 50 MCG/ACT nasal spray Place 1 spray into both nostrils in the morning and at bedtime.     irbesartan (AVAPRO) 150 MG tablet Take 1 tablet by mouth 2 (two) times daily.  3   magnesium oxide (MAG-OX) 400 (240 Mg) MG tablet Take 1 tablet (400 mg total) by mouth daily. 30 tablet 3   montelukast (SINGULAIR) 10 MG tablet Take 10 mg by mouth in the morning.     Multiple Vitamin (MULTIVITAMIN WITH MINERALS) TABS tablet Take 1 tablet by mouth in the morning.     Multiple Vitamins-Minerals (PRESERVISION AREDS 2 PO) Take 1 tablet by mouth in the morning and at bedtime.     pantoprazole (PROTONIX) 40 MG tablet Take 40 mg by mouth 2 (two) times daily as needed (acid reflux/indigestion.).      Prucalopride Succinate (MOTEGRITY) 2 MG TABS Take 2 mg by mouth in the morning.     zolpidem (AMBIEN) 5 MG tablet Take 5 mg by mouth at bedtime as needed for sleep.     acyclovir (ZOVIRAX) 400 MG tablet Take 400 mg by mouth 2 (two) times daily as needed (outbreaks).  12   chlorthalidone (HYGROTON) 25 MG tablet Take 1 tablet (25 mg total) by mouth daily. HOLD until follow-up with your cardiologist (Patient taking differently: Take 25 mg by mouth at bedtime. HOLD until follow-up with your cardiologist)     potassium chloride SA (KLOR-CON M) 20 MEQ tablet Take 1 tablet (20 mEq total) by mouth daily. 30 tablet 0   No current facility-administered medications for this visit.     No past medical history on file.  ROS:   All systems reviewed and negative except as noted in the HPI.   Past Surgical History:  Procedure Laterality Date   PACEMAKER IMPLANT N/A 04/16/2021   Procedure: PACEMAKER IMPLANT;  Surgeon: Marinus Maw, MD;  Location: [facility] INVASIVE CV LAB;  Service: Cardiovascular;  Laterality: N/A;     No family history on file.   Social History   Socioeconomic History   Marital status: Married    Spouse name: Not on file   Number of children:  Not on file   Years of education: Not on file   Highest education level: Not on file  Occupational History   Not on file  Tobacco Use   Smoking status: Never   Smokeless tobacco: Never  Substance and Sexual Activity   Alcohol use: Yes    Alcohol/week: 1.0 - 2.0 standard drink of alcohol    Types: 1 - 2 Glasses of wine per week   Drug use: Not on file   Sexual activity: Not on file  Other Topics Concern   Not on file  Social History Narrative   Not on file   Social Determinants of Health   Financial Resource Strain: Not on file  Food Insecurity: Not on file  Transportation Needs: Not on file  Physical Activity: Not on file  Stress: Not on file  Social Connections: Not on file  Intimate Partner Violence: Not on file      BP 116/64   Pulse 74   Ht 5\' 11"  (1.803 m)   Wt 249 lb (112.9 kg)   SpO2 97%   BMI 34.73 kg/m   Physical Exam:  Well appearing NAD HEENT: Unremarkable Neck:  No JVD, no thyromegally Lymphatics:  No adenopathy Back:  No CVA tenderness Lungs:  Clear with no wheezes HEART:  Regular rate rhythm, no murmurs, no rubs, no clicks Abd:  soft, positive bowel sounds, no organomegally, no rebound, no guarding Ext:  2 plus pulses, no edema, no cyanosis, no clubbing Skin:  No rashes no nodules Neuro:  CN II through XII intact, motor grossly intact  EKG - nsr with ventricular pacing  DEVICE  Normal device function.  See PaceArt for details.   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 PPM - his Biotronik DDD PM is working normally. We will recheck in several months HTN -his bp is a little elevated. I would prefer that he be off of his amlodipine.  Peripheral edema - he has chronic stasis and significant skin changes. I would like him to stop the amlodipine. However, I will defer to his primary MD.  

## 2021-10-03 ENCOUNTER — Ambulatory Visit: Payer: Medicare PPO | Admitting: Internal Medicine

## 2021-10-15 ENCOUNTER — Ambulatory Visit (INDEPENDENT_AMBULATORY_CARE_PROVIDER_SITE_OTHER): Payer: Medicare PPO

## 2021-10-15 DIAGNOSIS — I4510 Unspecified right bundle-branch block: Secondary | ICD-10-CM | POA: Diagnosis not present

## 2021-10-15 LAB — CUP PACEART REMOTE DEVICE CHECK
Battery Remaining Percentage: 100 %
Brady Statistic RA Percent Paced: 36 %
Brady Statistic RV Percent Paced: 94 %
Date Time Interrogation Session: 20230830083131
Implantable Lead Implant Date: 20230301
Implantable Lead Implant Date: 20230301
Implantable Lead Location: 753859
Implantable Lead Location: 753860
Implantable Lead Model: 377171
Implantable Lead Model: 377171
Implantable Lead Serial Number: 8000691836
Implantable Lead Serial Number: 8000717006
Implantable Pulse Generator Implant Date: 20230301
Lead Channel Impedance Value: 585 Ohm
Lead Channel Impedance Value: 624 Ohm
Lead Channel Pacing Threshold Amplitude: 0.8 V
Lead Channel Pacing Threshold Amplitude: 0.9 V
Lead Channel Pacing Threshold Pulse Width: 0.4 ms
Lead Channel Pacing Threshold Pulse Width: 0.4 ms
Lead Channel Sensing Intrinsic Amplitude: 12.7 mV
Lead Channel Sensing Intrinsic Amplitude: 2.5 mV
Lead Channel Setting Pacing Amplitude: 3 V
Lead Channel Setting Pacing Amplitude: 3 V
Lead Channel Setting Pacing Pulse Width: 0.4 ms
Pulse Gen Model: 407145
Pulse Gen Serial Number: 70324575

## 2021-10-26 NOTE — Progress Notes (Unsigned)
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 Feels habituated and that he needs CPAP. We looked at download report and with his weight loss, discussed trying without. Decided to do HST off CPAP.  Notes  more DOE- walking less. Never able to clear peripheral edema.  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 There are no signs of pulmonary edema or new focal infiltrates. Linear densities in the right lower lung fields suggest scarring or subsegmental atelectasis.  ROS-see HPI   + = positive Constitutional:    +weight loss, night sweats, fevers, chills, fatigue, lassitude. HEENT:    headaches, difficulty swallowing, tooth/dental problems, sore throat,       sneezing, itching, ear ache, nasal congestion, post nasal drip, snoring CV:    chest pain, orthopnea, PND, +swelling in lower extremities, anasarca,                                                          dizziness, + palpitations Resp:   + shortness of breath with exertion or at rest.                 productive cough,  + non-productive cough, coughing up of blood.              change in color of mucus.  wheezing.   Skin:    rash or lesions. GI:  No-   heartburn, indigestion, abdominal pain, nausea, vomiting, diarrhea,                 change in bowel habits, loss of appetite GU: dysuria, change in color of urine, no urgency or frequency.   flank pain. MS:   joint pain, stiffness, decreased range of motion, back pain. Neuro-     nothing unusual Psych:  change in mood or affect.  depression or anxiety.   memory loss.  OBJ- Physical Exam General- Alert, Oriented, Affect-appropriate, Distress- none acute, + morbidly obese Skin- rash-none, lesions- none, excoriation- none Lymphadenopathy- none Head- atraumatic            Eyes- Gross vision intact, PERRLA, conjunctivae and secretions clear            Ears- Hearing, canals-normal            Nose- Clear, no-Septal dev, mucus, polyps, erosion, perforation             Throat- Mallampati IV , mucosa clear , drainage- none, tonsils-  atrophic Neck- flexible , trachea midline, no stridor , thyroid nl, carotid no bruit Chest - symmetrical excursion , unlabored           Heart/CV- RRR , no murmur , no gallop  , no rub, nl s1 s2                           - JVD- none , edema+, stasis changes+, varices- none           Lung- + somewhat labored with conversation, more comfortable with mask off, clear to P&A, wheeze- none, cough- none , dullness-none, rub- none           Chest wall-  Abd-  Br/ Gen/ Rectal- Not done, not indicated Extrem- cyanosis- none, clubbing, none, atrophy- none, strength- nl Neuro- grossly intact to observation

## 2021-10-27 ENCOUNTER — Ambulatory Visit (INDEPENDENT_AMBULATORY_CARE_PROVIDER_SITE_OTHER): Payer: Medicare PPO | Admitting: Internal Medicine

## 2021-10-27 ENCOUNTER — Encounter: Payer: Self-pay | Admitting: Internal Medicine

## 2021-10-27 VITALS — BP 128/78 | HR 78 | Ht 71.0 in | Wt 245.3 lb

## 2021-10-27 DIAGNOSIS — G4733 Obstructive sleep apnea (adult) (pediatric): Secondary | ICD-10-CM | POA: Diagnosis not present

## 2021-10-27 DIAGNOSIS — J449 Chronic obstructive pulmonary disease, unspecified: Secondary | ICD-10-CM

## 2021-10-27 NOTE — Patient Instructions (Addendum)
Order- DME [facility]- please replace old CPAP machine auto 8-12, mask of choice, humidifier, supplies, Airview/ card  We talked about trying otc Mucinex to see if it helps with thick sticky mucus.  Please call if we can help

## 2021-10-27 NOTE — Assessment & Plan Note (Signed)
He likes Breztri better than prior Trelegy. Plan- continue Breztri, add Mucinex trial

## 2021-10-27 NOTE — Assessment & Plan Note (Signed)
Working to get his weight down

## 2021-10-27 NOTE — Assessment & Plan Note (Signed)
Order replacement old CPAP machine. Benefits from use. Auto 8-12

## 2021-10-31 ENCOUNTER — Encounter: Payer: Self-pay | Admitting: Internal Medicine

## 2021-10-31 ENCOUNTER — Telehealth: Payer: Self-pay | Admitting: Internal Medicine

## 2021-10-31 NOTE — Telephone Encounter (Signed)
Dr. Maple Hudson, please advise if you would be okay with pt's next appt being a mychart visit.

## 2021-11-01 NOTE — Telephone Encounter (Signed)
That will be fine. 

## 2021-11-03 NOTE — Telephone Encounter (Signed)
Will change next appointment to mychart. Nothing further needed

## 2021-11-04 NOTE — Telephone Encounter (Signed)
Sure- I will ask staff (Elise)to schedule a video visit sometime in that 31-90 day time window that insurances request after ou got your replacement CPAP.

## 2021-11-04 NOTE — Telephone Encounter (Signed)
Dr. Annamaria Boots, please advise on pt's message regarding his first follow up after cpap start. Thanks.

## 2021-11-07 NOTE — Progress Notes (Signed)
Remote pacemaker transmission.   

## 2021-12-02 ENCOUNTER — Telehealth: Payer: Medicare PPO | Admitting: Internal Medicine

## 2021-12-05 ENCOUNTER — Other Ambulatory Visit: Payer: Self-pay | Admitting: Orthopaedic Surgery

## 2021-12-05 DIAGNOSIS — M79671 Pain in right foot: Secondary | ICD-10-CM

## 2021-12-26 ENCOUNTER — Other Ambulatory Visit: Payer: Self-pay | Admitting: Orthopaedic Surgery

## 2021-12-26 DIAGNOSIS — M79671 Pain in right foot: Secondary | ICD-10-CM

## 2021-12-28 NOTE — Progress Notes (Unsigned)
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  Has rash on cheek from 5-FU/ dermatology. Chronic cough, tenacious clear mucus. Discussed trial of mucinex. Suspect he is staying a little dry. Has been able to lose some weight. Download reviewed. Doing well with nasal pillows  Has felt better since pacemaker placed for heart block CXR 04/16/21- IMPRESSION: Interval placement of pacemaker battery in the left infraclavicular region. There are no signs of pulmonary edema or new focal infiltrates. Linear densities in the right lower lung fields suggest scarring or subsegmental atelectasis.  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 HEENT:    headaches, difficulty swallowing, tooth/dental problems, sore throat,       sneezing, itching, ear ache, nasal congestion, post nasal drip, snoring CV:    chest pain, orthopnea, PND, +swelling in lower extremities, anasarca,                                                          dizziness, + palpitations Resp:   + shortness of breath with exertion or at rest.                 productive cough,  + non-productive cough, coughing up of blood.              change in color of mucus.  wheezing.   Skin:    rash or lesions. GI:  No-   heartburn, indigestion, abdominal pain, nausea, vomiting, diarrhea,                 change in bowel habits, loss of appetite GU: dysuria, change in color of urine, no urgency or frequency.   flank pain. MS:   joint pain, stiffness, decreased range of motion, back pain. Neuro-     nothing unusual Psych:  change in mood or affect.  depression or anxiety.   memory loss.  OBJ- Physical Exam General- Alert, Oriented, Affect-appropriate, Distress- none acute, + morbidly obese Skin- rash+ R cheek "5-FU" Lymphadenopathy- none Head- atraumatic            Eyes- Gross vision intact, PERRLA, conjunctivae and secretions clear            Ears- Hearing, canals-normal            Nose- ++ rhinophyma            Throat- Mallampati IV , mucosa clear ,  drainage- none, tonsils- atrophic Neck- flexible , trachea midline, no stridor , thyroid nl, carotid no bruit Chest - symmetrical excursion , unlabored           Heart/CV- RRR , no murmur , no gallop  , no rub, nl s1 s2                           - JVD- none , edema-none, stasis changes+, varices- none           Lung- + somewhat labored with conversation, more comfortable with mask off, clear to P&A, wheeze- none, cough- none , dullness-none, rub- none           Chest wall- +L pacemaker Abd-  Br/ Gen/ Rectal- Not done, not indicated Extrem- +stasis changes legs Neuro- grossly intact to observation

## 2021-12-29 ENCOUNTER — Ambulatory Visit
Admission: RE | Admit: 2021-12-29 | Discharge: 2021-12-29 | Disposition: A | Discharge status: Home / Self Care | Payer: Medicare PPO | Source: Ambulatory Visit | Attending: Orthopaedic Surgery | Admitting: Orthopaedic Surgery

## 2021-12-29 DIAGNOSIS — M79671 Pain in right foot: Secondary | ICD-10-CM

## 2021-12-30 ENCOUNTER — Encounter: Payer: Self-pay | Admitting: Internal Medicine

## 2021-12-30 ENCOUNTER — Ambulatory Visit: Payer: Medicare PPO | Admitting: Internal Medicine

## 2021-12-30 VITALS — BP 122/62 | HR 61 | Ht 70.5 in | Wt 242.4 lb

## 2021-12-30 DIAGNOSIS — G4733 Obstructive sleep apnea (adult) (pediatric): Secondary | ICD-10-CM

## 2021-12-30 DIAGNOSIS — Z950 Presence of cardiac pacemaker: Secondary | ICD-10-CM

## 2021-12-30 NOTE — Assessment & Plan Note (Signed)
Benefits from CPAP with good compliance and control Plan- continue auto 8-12 

## 2021-12-30 NOTE — Patient Instructions (Signed)
We can continue CPAP auto 8-12 cwp  Please call if we can help

## 2021-12-30 NOTE — Assessment & Plan Note (Signed)
Placed by cardiology for bradycardia. Feeling much better.

## 2022-01-14 ENCOUNTER — Ambulatory Visit (INDEPENDENT_AMBULATORY_CARE_PROVIDER_SITE_OTHER): Payer: Medicare PPO

## 2022-01-14 DIAGNOSIS — I4510 Unspecified right bundle-branch block: Secondary | ICD-10-CM

## 2022-01-16 LAB — CUP PACEART REMOTE DEVICE CHECK
Date Time Interrogation Session: 20231130073634
Implantable Lead Connection Status: 753985
Implantable Lead Connection Status: 753985
Implantable Lead Implant Date: 20230301
Implantable Lead Implant Date: 20230301
Implantable Lead Location: 753859
Implantable Lead Location: 753860
Implantable Lead Model: 377171
Implantable Lead Model: 377171
Implantable Lead Serial Number: 8000691836
Implantable Lead Serial Number: 8000717006
Implantable Pulse Generator Implant Date: 20230301
Pulse Gen Model: 407145
Pulse Gen Serial Number: 70324575

## 2022-02-06 NOTE — Progress Notes (Signed)
Remote pacemaker transmission.   

## 2022-04-15 ENCOUNTER — Ambulatory Visit: Payer: Medicare PPO

## 2022-04-15 DIAGNOSIS — I4510 Unspecified right bundle-branch block: Secondary | ICD-10-CM

## 2022-04-16 LAB — CUP PACEART REMOTE DEVICE CHECK
Date Time Interrogation Session: 20240229074744
Implantable Lead Connection Status: 753985
Implantable Lead Connection Status: 753985
Implantable Lead Implant Date: 20230301
Implantable Lead Implant Date: 20230301
Implantable Lead Location: 753859
Implantable Lead Location: 753860
Implantable Lead Model: 377171
Implantable Lead Model: 377171
Implantable Lead Serial Number: 8000691836
Implantable Lead Serial Number: 8000717006
Implantable Pulse Generator Implant Date: 20230301
Pulse Gen Model: 407145
Pulse Gen Serial Number: 70324575

## 2022-05-18 NOTE — Progress Notes (Signed)
Remote pacemaker transmission.   

## 2022-07-02 ENCOUNTER — Telehealth: Payer: Self-pay

## 2022-07-02 NOTE — Telephone Encounter (Signed)
Following alert received from [employer] received for Biotronik . New onset AF, no OAC on file. Attempted to call patient to assess and recommend AF clinic referral.

## 2022-07-03 NOTE — Telephone Encounter (Signed)
Pt appt made.  °

## 2022-07-03 NOTE — Telephone Encounter (Signed)
Patient advised of new onset AF. Reports of increased shortness of breath at times. Recommended AF clinic referral to discuss OAC. Patient agreeable.   Patient requested to be called on cell phone @ [phone].

## 2022-07-07 ENCOUNTER — Ambulatory Visit (HOSPITAL_COMMUNITY)
Admission: RE | Admit: 2022-07-07 | Discharge: 2022-07-07 | Disposition: A | Discharge status: Home / Self Care | Payer: Medicare PPO | Source: Ambulatory Visit | Attending: Internal Medicine | Admitting: Internal Medicine

## 2022-07-07 VITALS — BP 158/76 | HR 65 | Ht 70.5 in | Wt 241.4 lb

## 2022-07-07 DIAGNOSIS — G4733 Obstructive sleep apnea (adult) (pediatric): Secondary | ICD-10-CM | POA: Diagnosis not present

## 2022-07-07 DIAGNOSIS — I10 Essential (primary) hypertension: Secondary | ICD-10-CM | POA: Diagnosis not present

## 2022-07-07 DIAGNOSIS — D696 Thrombocytopenia, unspecified: Secondary | ICD-10-CM | POA: Diagnosis not present

## 2022-07-07 DIAGNOSIS — Z6834 Body mass index (BMI) 34.0-34.9, adult: Secondary | ICD-10-CM | POA: Diagnosis not present

## 2022-07-07 DIAGNOSIS — Z7182 Exercise counseling: Secondary | ICD-10-CM | POA: Diagnosis not present

## 2022-07-07 DIAGNOSIS — Z7901 Long term (current) use of anticoagulants: Secondary | ICD-10-CM | POA: Insufficient documentation

## 2022-07-07 DIAGNOSIS — I480 Paroxysmal atrial fibrillation: Secondary | ICD-10-CM | POA: Diagnosis present

## 2022-07-07 DIAGNOSIS — E669 Obesity, unspecified: Secondary | ICD-10-CM | POA: Insufficient documentation

## 2022-07-07 DIAGNOSIS — D6869 Other thrombophilia: Secondary | ICD-10-CM | POA: Insufficient documentation

## 2022-07-07 LAB — CBC
HCT: 39.1 % (ref 39.0–52.0)
Hemoglobin: 13.5 g/dL (ref 13.0–17.0)
MCH: 31.1 pg (ref 26.0–34.0)
MCHC: 34.5 g/dL (ref 30.0–36.0)
MCV: 90.1 fL (ref 80.0–100.0)
Platelets: 150 10*3/uL (ref 150–400)
RBC: 4.34 MIL/uL (ref 4.22–5.81)
RDW: 12.6 % (ref 11.5–15.5)
WBC: 7.4 10*3/uL (ref 4.0–10.5)
nRBC: 0 % (ref 0.0–0.2)

## 2022-07-07 LAB — BASIC METABOLIC PANEL
Anion gap: 9 (ref 5–15)
BUN: 9 mg/dL (ref 8–23)
CO2: 26 mmol/L (ref 22–32)
Calcium: 8.9 mg/dL (ref 8.9–10.3)
Chloride: 94 mmol/L — ABNORMAL LOW (ref 98–111)
Creatinine, Ser: 0.69 mg/dL (ref 0.61–1.24)
GFR, Estimated: >60 mL/min (ref >60)
Glucose, Bld: 107 mg/dL — ABNORMAL HIGH (ref 70–99)
Potassium: 3.7 mmol/L (ref 3.5–5.1)
Sodium: 129 mmol/L — ABNORMAL LOW (ref 135–145)

## 2022-07-07 LAB — MAGNESIUM: Magnesium: 2.1 mg/dL (ref 1.7–2.4)

## 2022-07-07 MED ORDER — APIXABAN 5 MG PO TABS: 5.0000 mg | ORAL_TABLET | Freq: Two times a day (BID) | ORAL | 3 refills | Status: DC

## 2022-07-07 NOTE — Patient Instructions (Signed)
Start Eliquis 5mg twice a day 

## 2022-07-07 NOTE — Progress Notes (Signed)
Primary Care Physician: Thea Alken, MD Primary Cardiologist: None Primary Electrophysiologist: Dr. Ladona Ridgel Referring Physician: Dr. Rennie Plowman is a 82 y.o. 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  The patient was initially diagnosed with atrial fibrillation on 07/01/22 after alert received from [employer] for [employer] device. He reported increased shortness of breath at times. Patient has a CHADS2VASC score of 3.  On evaluation today, he is currently in NSR. He reports not have cardiac awareness of Afib when alert took place on May 15th. He did acknowledge around that time feeling poorly in terms of tired and short of breath. Wife has [employer] device and they were monitoring him noting HR 90s during episode. He has felt overall well since Friday (no further episodes). He is compliant with his CPAP. Reducing weekly alcohol intake.  Today, he denies symptoms of palpitations, chest pain, orthopnea, PND, lower extremity edema, dizziness, presyncope, syncope, snoring, daytime somnolence, bleeding, or neurologic sequela. The patient is tolerating medications without difficulties and is otherwise without complaint today.   Atrial Fibrillation Risk Factors:  he does have symptoms or diagnosis of sleep apnea. he is compliant with CPAP therapy. he does not have a history of rheumatic fever. he does not have a history of alcohol use. The patient does not have a history of early familial atrial fibrillation or other arrhythmias.  he has a BMI of Body mass index is 34.15 kg/m.Marland Kitchen Filed Weights   07/07/22 1453  Weight: 109.5 kg    No family history on file.   Atrial Fibrillation Management history:  Previous antiarrhythmic drugs: None Previous  cardioversions: None Previous ablations: None Anticoagulation history: None   No past medical history on file. Past Surgical History:  Procedure Laterality Date   PACEMAKER IMPLANT N/A 04/16/2021   Procedure: PACEMAKER IMPLANT;  Surgeon: Marinus Maw, MD;  Location: [facility] INVASIVE CV LAB;  Service: Cardiovascular;  Laterality: N/A;    Current Outpatient Medications  Medication Sig Dispense Refill   acyclovir ointment (ZOVIRAX) 5 % Apply 1 application topically as needed (outbreaks).     albuterol (PROAIR HFA) 108 (90 Base) MCG/ACT inhaler Inhale 2 puffs every 6 hours as needed- rescue (Patient taking differently: Inhale 2 puffs into the lungs in the morning and at bedtime.) 1 Inhaler 12   atorvastatin (LIPITOR) 10 MG tablet Take 10 mg by mouth in the morning.  3   Budeson-Glycopyrrol-Formoterol (BREZTRI AEROSPHERE) 160-9-4.8 MCG/ACT AERO Inhale 2 puffs into the lungs in the morning and at bedtime. 10.7 g 6   CARDURA XL 8 MG 24 hr tablet Take 8 mg by mouth at bedtime.     celecoxib (CELEBREX) 100 MG capsule Take 100 mg by mouth as needed.     diazepam (VALIUM) 10 MG tablet Take 10 mg by mouth as needed for sleep.     eplerenone (INSPRA) 50 MG tablet Take 50 mg by mouth 2 (two) times daily.     fluticasone (FLONASE) 50 MCG/ACT nasal spray Place 1 spray into both nostrils as needed.     hydrochlorothiazide (HYDRODIURIL) 25 MG tablet Take 25 mg by mouth daily.     IBSRELA 50 MG TABS Take 50 mg by mouth 2 (two) times daily.     ibuprofen (ADVIL) 600 MG tablet Take 600 mg by  mouth 2 (two) times daily.     irbesartan (AVAPRO) 150 MG tablet Take 1 tablet by mouth 2 (two) times daily.  3   LINZESS 72 MCG capsule Take 72 mcg by mouth as needed.     magnesium oxide (MAG-OX) 400 (240 Mg) MG tablet Take 1 tablet (400 mg total) by mouth daily. 30 tablet 3   montelukast (SINGULAIR) 10 MG tablet Take 10 mg by mouth every evening.     Multiple Vitamin (MULTIVITAMIN WITH MINERALS) TABS tablet Take 1 tablet  by mouth in the morning.     Multiple Vitamins-Minerals (PRESERVISION AREDS 2 PO) Take 1 tablet by mouth in the morning and at bedtime.     nystatin-triamcinolone (MYCOLOG II) cream Apply topically 2 (two) times daily.     pantoprazole (PROTONIX) 40 MG tablet Take 40 mg by mouth as needed. As needed     potassium chloride SA (KLOR-CON M) 20 MEQ tablet Take 20 mEq by mouth daily.     Prucalopride Succinate (MOTEGRITY) 2 MG TABS Take 2 mg by mouth in the morning.     TRULANCE 3 MG TABS Take 3 mg by mouth as needed.     zolpidem (AMBIEN) 5 MG tablet Take 5 mg by mouth at bedtime as needed for sleep. prn     No current facility-administered medications for this encounter.    No Known Allergies  Social History   Socioeconomic History   Marital status: Married    Spouse name: Not on file   Number of children: Not on file   Years of education: Not on file   Highest education level: Not on file  Occupational History   Not on file  Tobacco Use   Smoking status: Never   Smokeless tobacco: Never  Substance and Sexual Activity   Alcohol use: Yes    Alcohol/week: 1.0 - 2.0 standard drink of alcohol    Types: 1 - 2 Glasses of wine per week   Drug use: Not on file   Sexual activity: Not on file  Other Topics Concern   Not on file  Social History Narrative   Not on file   Social Determinants of Health   Financial Resource Strain: Not on file  Food Insecurity: Not on file  Transportation Needs: Not on file  Physical Activity: Not on file  Stress: Not on file  Social Connections: Not on file  Intimate Partner Violence: Not on file    ROS- All systems are reviewed and negative except as per the HPI above.  Physical Exam: Vitals:   07/07/22 1453  Weight: 109.5 kg  Height: 5' 10.5" (1.791 m)    GEN- The patient is a well appearing male, alert and oriented x 3 today.   Head- normocephalic, atraumatic Eyes-  Sclera clear, conjunctiva pink Ears- hearing intact Oropharynx-  clear Neck- supple  Lungs- Clear to ausculation bilaterally, normal work of breathing Heart- Regular rate and rhythm, no murmurs, rubs or gallops  GI- soft, NT, ND, + BS Extremities- no clubbing, cyanosis, or edema MS- no significant deformity or atrophy Skin- no rash or lesion Psych- euthymic mood, full affect Neuro- strength and sensation are intact  Wt Readings from Last 3 Encounters:  07/07/22 109.5 kg  12/30/21 110 kg  10/27/21 111.3 kg    EKG today demonstrates  Vent. rate 65 BPM PR interval 178 ms QRS duration 160 ms QT/QTcB 440/457 ms P-R-T axes 78 -70 80 AV dual-paced rhythm with frequent ventricular-paced complexes Abnormal ECG When compared  with ECG of 16-Apr-2021 16:41, PREVIOUS ECG IS PRESENT  Echo 04/12/21 demonstrated: 1. Left ventricular ejection fraction, by estimation, is 60 to 65%. The  left ventricle has normal function. The left ventricle has no regional  wall motion abnormalities. The left ventricular internal cavity size was  moderately dilated. Left ventricular  diastolic parameters are indeterminate.   2. Right ventricular systolic function is moderately reduced. The right  ventricular size is normal. Mildly increased right ventricular wall  thickness. Tricuspid regurgitation signal is inadequate for assessing PA  pressure.   3. Left atrial size was severely dilated.   4. The mitral valve is normal in structure. Trivial mitral valve  regurgitation. No evidence of mitral stenosis.   5. The aortic valve is tricuspid. Aortic valve regurgitation is not  visualized. Aortic valve sclerosis is present, with no evidence of aortic  valve stenosis.   6. Aortic dilatation noted. There is mild dilatation of the ascending  aorta, measuring 40 mm.   7. The inferior vena cava is normal in size with greater than 50%  respiratory variability, suggesting right atrial pressure of 3 mmHg.   Epic records are reviewed at length today.  CHA2DS2-VASc Score = 3  The  patient's score is based upon: CHF History: 0 HTN History: 1 Diabetes History: 0 Stroke History: 0 Vascular Disease History: 0 Age Score: 2 Gender Score: 0       ASSESSMENT AND PLAN: Paroxysmal Atrial Fibrillation (ICD10:  I48.0) The patient's CHA2DS2-VASc score is 3, indicating a 3.2% annual risk of stroke.    Education provided about Afib with visual diagram. Discussion about potential for medication treatments or ablation in the future if indicated. Pamphlet on Watchman procedure given as he is interested in not taking Eliquis long term.  We will plan for 1 month f/u and coordinate with device clinic to assist patient to manually submit device interrogation at that time.  Will check baseline labs today as patient historically has had low platelets, low K, and low magnesium.    2. Secondary Hypercoagulable State (ICD10:  D68.69) The patient is at significant risk for stroke/thromboembolism based upon his CHA2DS2-VASc Score of 3.  Start Apixaban (Eliquis).  Discussion about indication for anticoagulation - patient agrees to this and we will start anticoagulation today.  Will repeat CBC in 1 month.   3. Obesity Body mass index is 34.15 kg/m. Lifestyle modification was discussed at length including regular exercise and weight reduction. Encouraged daily walking as tolerated.  4. Obstructive sleep apnea The importance of adequate treatment of sleep apnea was discussed today in order to improve our ability to maintain sinus rhythm long term. He is compliant with his CPAP therapy.  5. HTN Stable today, no changes at this time.    Follow up in 1 month Afib clinic for repeat CBC and assess Afib burden.    Lake Bells, PA-C Afib Clinic [facility] [address] [place], [place] [address] [phone] 07/07/2022 3:13 PM

## 2022-07-14 ENCOUNTER — Telehealth: Payer: Self-pay | Admitting: Internal Medicine

## 2022-07-14 NOTE — Telephone Encounter (Signed)
Please disregard

## 2022-07-15 ENCOUNTER — Ambulatory Visit (INDEPENDENT_AMBULATORY_CARE_PROVIDER_SITE_OTHER): Payer: Medicare PPO

## 2022-07-15 DIAGNOSIS — I4510 Unspecified right bundle-branch block: Secondary | ICD-10-CM | POA: Diagnosis not present

## 2022-07-16 LAB — CUP PACEART REMOTE DEVICE CHECK
Battery Remaining Percentage: 90 %
Brady Statistic RA Percent Paced: 36 %
Brady Statistic RV Percent Paced: 95 %
Date Time Interrogation Session: 20240529104044
Implantable Lead Connection Status: 753985
Implantable Lead Connection Status: 753985
Implantable Lead Implant Date: 20230301
Implantable Lead Implant Date: 20230301
Implantable Lead Location: 753859
Implantable Lead Location: 753860
Implantable Lead Model: 377171
Implantable Lead Model: 377171
Implantable Lead Serial Number: 8000691836
Implantable Lead Serial Number: 8000717006
Implantable Pulse Generator Implant Date: 20230301
Lead Channel Impedance Value: 527 Ohm
Lead Channel Impedance Value: 566 Ohm
Lead Channel Pacing Threshold Amplitude: 0.8 V
Lead Channel Pacing Threshold Pulse Width: 0.4 ms
Lead Channel Sensing Intrinsic Amplitude: 1.3 mV
Lead Channel Sensing Intrinsic Amplitude: 12.4 mV
Lead Channel Setting Pacing Amplitude: 2 V
Lead Channel Setting Pacing Amplitude: 2 V
Lead Channel Setting Pacing Pulse Width: 0.4 ms
Pulse Gen Model: 407145
Pulse Gen Serial Number: 70324575

## 2022-07-26 ENCOUNTER — Encounter: Payer: Self-pay | Admitting: Internal Medicine

## 2022-07-27 NOTE — Telephone Encounter (Signed)
Called and spoke with patient.  Patient scheduled 1 year annual follow up with Dr. Maple Hudson 09/22/22 at 1:30pm.  Nothing further at this time.

## 2022-08-03 ENCOUNTER — Ambulatory Visit (HOSPITAL_COMMUNITY): Payer: Medicare PPO | Admitting: Internal Medicine

## 2022-08-04 ENCOUNTER — Encounter (HOSPITAL_COMMUNITY): Payer: Self-pay | Admitting: Internal Medicine

## 2022-08-04 ENCOUNTER — Ambulatory Visit (HOSPITAL_COMMUNITY)
Admission: RE | Admit: 2022-08-04 | Discharge: 2022-08-04 | Disposition: A | Discharge status: Home / Self Care | Payer: Medicare PPO | Source: Ambulatory Visit | Attending: Internal Medicine | Admitting: Internal Medicine

## 2022-08-04 ENCOUNTER — Telehealth: Payer: Self-pay

## 2022-08-04 ENCOUNTER — Encounter (HOSPITAL_COMMUNITY): Payer: Self-pay

## 2022-08-04 VITALS — BP 122/72 | HR 107 | Ht 70.5 in | Wt 242.4 lb

## 2022-08-04 DIAGNOSIS — G4733 Obstructive sleep apnea (adult) (pediatric): Secondary | ICD-10-CM | POA: Insufficient documentation

## 2022-08-04 DIAGNOSIS — Z6834 Body mass index (BMI) 34.0-34.9, adult: Secondary | ICD-10-CM | POA: Insufficient documentation

## 2022-08-04 DIAGNOSIS — R0602 Shortness of breath: Secondary | ICD-10-CM | POA: Diagnosis not present

## 2022-08-04 DIAGNOSIS — E669 Obesity, unspecified: Secondary | ICD-10-CM | POA: Insufficient documentation

## 2022-08-04 DIAGNOSIS — I480 Paroxysmal atrial fibrillation: Secondary | ICD-10-CM | POA: Insufficient documentation

## 2022-08-04 DIAGNOSIS — Z7901 Long term (current) use of anticoagulants: Secondary | ICD-10-CM | POA: Insufficient documentation

## 2022-08-04 DIAGNOSIS — I10 Essential (primary) hypertension: Secondary | ICD-10-CM | POA: Insufficient documentation

## 2022-08-04 DIAGNOSIS — D6859 Other primary thrombophilia: Secondary | ICD-10-CM | POA: Diagnosis not present

## 2022-08-04 DIAGNOSIS — D6869 Other thrombophilia: Secondary | ICD-10-CM | POA: Diagnosis not present

## 2022-08-04 NOTE — Progress Notes (Signed)
Primary Care Physician: Thea Alken, MD Primary Cardiologist: None Primary Electrophysiologist: Dr. Ladona Ridgel Referring Physician: Dr. Rennie Plowman is a 82 y.o. 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  The patient was initially diagnosed with atrial fibrillation on 07/01/22 after alert received from [employer] for [employer] device. He reported increased shortness of breath at times. Patient has a CHADS2VASC score of 3.  On evaluation today, he is currently in NSR. He reports not have cardiac awareness of Afib when alert took place on May 15th. He did acknowledge around that time feeling poorly in terms of tired and short of breath. Wife has [employer] device and they were monitoring him noting HR 90s during episode. He has felt overall well since Friday (no further episodes). He is compliant with his CPAP. Reducing weekly alcohol intake.  On follow up 08/04/22, he is currently in NSR. Device clinic confirmed no additional episodes of Afib since initial episode. He has not missed any doses of anticoagulant. He recently had outside lab work on 6/14 and PLT is 129, down from 150 on 07/07/22. Prior PLT were 121 and 138 in February 2023.   Today, he denies symptoms of palpitations, chest pain, orthopnea, PND, lower extremity edema, dizziness, presyncope, syncope, snoring, daytime somnolence, bleeding, or neurologic sequela. The patient is tolerating medications without difficulties and is otherwise without complaint today.   Atrial Fibrillation Risk Factors:  he does have symptoms or diagnosis of sleep apnea. he is compliant with CPAP therapy. he does not have a history of rheumatic fever. he does not have a history of alcohol use. The patient does not have a  history of early familial atrial fibrillation or other arrhythmias.  he has a BMI of Body mass index is 34.29 kg/m.Marland Kitchen Filed Weights   08/04/22 1317  Weight: 110 kg     History reviewed. No pertinent family history.   Atrial Fibrillation Management history:  Previous antiarrhythmic drugs: None Previous cardioversions: None Previous ablations: None Anticoagulation history: Eliquis 5 mg BID   History reviewed. No pertinent past medical history. Past Surgical History:  Procedure Laterality Date   PACEMAKER IMPLANT N/A 04/16/2021   Procedure: PACEMAKER IMPLANT;  Surgeon: Marinus Maw, MD;  Location: [facility] INVASIVE CV LAB;  Service: Cardiovascular;  Laterality: N/A;    Current Outpatient Medications  Medication Sig Dispense Refill   acyclovir ointment (ZOVIRAX) 5 % Apply 1 application topically as needed (outbreaks).     albuterol (PROAIR HFA) 108 (90 Base) MCG/ACT inhaler Inhale 2 puffs every 6 hours as needed- rescue (Patient taking differently: Inhale 2 puffs into the lungs in the morning and at bedtime.) 1 Inhaler 12   apixaban (ELIQUIS) 5 MG TABS tablet Take 1 tablet (5 mg total) by mouth 2 (two) times daily. 60 tablet 3   atorvastatin (LIPITOR) 10 MG tablet Take 10 mg by mouth in the morning.  3   Budeson-Glycopyrrol-Formoterol (BREZTRI AEROSPHERE) 160-9-4.8 MCG/ACT AERO Inhale 2 puffs into the lungs in the morning and at bedtime. 10.7 g 6   CARDURA XL 8 MG 24 hr tablet Take 8 mg by mouth at bedtime.     diazepam (VALIUM) 10 MG tablet Take 10 mg by mouth as needed for sleep.     eplerenone (INSPRA) 50 MG tablet Take 50 mg  by mouth 2 (two) times daily.     fluticasone (FLONASE) 50 MCG/ACT nasal spray Place 1 spray into both nostrils as needed.     hydrochlorothiazide (HYDRODIURIL) 25 MG tablet Take 25 mg by mouth daily.     IBSRELA 50 MG TABS Take 50 mg by mouth 2 (two) times daily.     irbesartan (AVAPRO) 150 MG tablet Take 1 tablet by mouth 2 (two) times daily.  3   LINZESS 72  MCG capsule Take 72 mcg by mouth as needed.     magnesium oxide (MAG-OX) 400 (240 Mg) MG tablet Take 1 tablet (400 mg total) by mouth daily. 30 tablet 3   montelukast (SINGULAIR) 10 MG tablet Take 10 mg by mouth every evening.     Multiple Vitamin (MULTIVITAMIN WITH MINERALS) TABS tablet Take 1 tablet by mouth in the morning.     Multiple Vitamins-Minerals (PRESERVISION AREDS 2 PO) Take 1 tablet by mouth in the morning and at bedtime.     nystatin-triamcinolone (MYCOLOG II) cream Apply topically as needed.     pantoprazole (PROTONIX) 40 MG tablet Take 40 mg by mouth daily. As needed     potassium chloride SA (KLOR-CON M) 20 MEQ tablet Take 20 mEq by mouth daily.     Prucalopride Succinate (MOTEGRITY) 2 MG TABS Take 2 mg by mouth in the morning.     TRULANCE 3 MG TABS Take 3 mg by mouth as needed.     zolpidem (AMBIEN) 5 MG tablet Take 5 mg by mouth at bedtime as needed for sleep. prn     celecoxib (CELEBREX) 100 MG capsule Take 100 mg by mouth as needed. (Patient not taking: Reported on 08/04/2022)     ibuprofen (ADVIL) 600 MG tablet Take 600 mg by mouth 2 (two) times daily. (Patient not taking: Reported on 08/04/2022)     No current facility-administered medications for this encounter.    No Known Allergies  Social History   Socioeconomic History   Marital status: Married    Spouse name: Not on file   Number of children: Not on file   Years of education: Not on file   Highest education level: Not on file  Occupational History   Not on file  Tobacco Use   Smoking status: Never   Smokeless tobacco: Never  Vaping Use   Vaping Use: Never used  Substance and Sexual Activity   Alcohol use: Yes    Alcohol/week: 5.0 standard drinks of alcohol    Types: 5 Glasses of wine per week    Comment: occ   Drug use: Never   Sexual activity: Not on file  Other Topics Concern   Not on file  Social History Narrative   Not on file   Social Determinants of Health   Financial Resource Strain:  Not on file  Food Insecurity: Not on file  Transportation Needs: Not on file  Physical Activity: Not on file  Stress: Not on file  Social Connections: Not on file  Intimate Partner Violence: Not on file    ROS- All systems are reviewed and negative except as per the HPI above.  Physical Exam: Vitals:   08/04/22 1317  BP: 122/72  Pulse: (!) 107  Weight: 110 kg  Height: 5' 10.5" (1.791 m)    GEN- The patient is well appearing, alert and oriented x 3 today.   Head- normocephalic, atraumatic Eyes-  Sclera clear, conjunctiva pink Ears- hearing intact Lungs- Clear to ausculation bilaterally, normal work of breathing  Heart- Regular rate and rhythm, no murmurs, rubs or gallops, PMI not laterally displaced Extremities- no clubbing, cyanosis, or edema MS- no significant deformity or atrophy Skin- no rash or lesion Psych- euthymic mood, full affect Neuro- strength and sensation are intact   Wt Readings from Last 3 Encounters:  08/04/22 110 kg  07/07/22 109.5 kg  12/30/21 110 kg    EKG today demonstrates  Vent. rate 107 BPM PR interval 156 ms QRS duration 130 ms QT/QTcB 364/485 ms P-R-T axes 79 -34 -5 Atrial-sensed ventricular-paced rhythm Abnormal ECG When compared with ECG of 07-Jul-2022 15:13, PREVIOUS ECG IS PRESENT  Echo 04/12/21 demonstrated: 1. Left ventricular ejection fraction, by estimation, is 60 to 65%. The  left ventricle has normal function. The left ventricle has no regional  wall motion abnormalities. The left ventricular internal cavity size was  moderately dilated. Left ventricular  diastolic parameters are indeterminate.   2. Right ventricular systolic function is moderately reduced. The right  ventricular size is normal. Mildly increased right ventricular wall  thickness. Tricuspid regurgitation signal is inadequate for assessing PA  pressure.   3. Left atrial size was severely dilated.   4. The mitral valve is normal in structure. Trivial mitral  valve  regurgitation. No evidence of mitral stenosis.   5. The aortic valve is tricuspid. Aortic valve regurgitation is not  visualized. Aortic valve sclerosis is present, with no evidence of aortic  valve stenosis.   6. Aortic dilatation noted. There is mild dilatation of the ascending  aorta, measuring 40 mm.   7. The inferior vena cava is normal in size with greater than 50%  respiratory variability, suggesting right atrial pressure of 3 mmHg.   Epic records are reviewed at length today.  CHA2DS2-VASc Score = 3  The patient's score is based upon: CHF History: 0 HTN History: 1 Diabetes History: 0 Stroke History: 0 Vascular Disease History: 0 Age Score: 2 Gender Score: 0       ASSESSMENT AND PLAN: Paroxysmal Atrial Fibrillation (ICD10:  I48.0) The patient's CHA2DS2-VASc score is 3, indicating a 3.2% annual risk of stroke.    Patient is in NSR. No episodes of Afib over the past month. We will continue with conservative observation.   2. Secondary Hypercoagulable State (ICD10:  D68.69) The patient is at significant risk for stroke/thromboembolism based upon his CHA2DS2-VASc Score of 3.  Start Apixaban (Eliquis).   PLT count from 6/14 is 129, down from 150 on 07/07/22. He has history of low PLT with counts of 121 and 138 in February 2023. Recommend repeat CBC in ~6-8 weeks to trend.  3. Obesity Body mass index is 34.29 kg/m. Lifestyle modification was discussed at length including regular exercise and weight reduction. Encouraged daily walking as tolerated.  4. Obstructive sleep apnea The importance of adequate treatment of sleep apnea was discussed today in order to improve our ability to maintain sinus rhythm long term. He is compliant with his CPAP therapy.  5. HTN Stable today, no changes at this time.    Follow up as scheduled with Dr. Ladona Ridgel. F/u Afib clinic prn.    Lake Bells, PA-C Afib Clinic [facility] [address] [place], [place]  [address] [phone] 08/04/2022 2:22 PM

## 2022-08-04 NOTE — Addendum Note (Signed)
Encounter addended by: Learta Codding, CMA on: 08/04/2022 4:46 PM  Actions taken: Order list changed

## 2022-08-04 NOTE — Telephone Encounter (Signed)
     No AF since the event on 07/02/22

## 2022-08-06 NOTE — Progress Notes (Signed)
Remote pacemaker transmission.   

## 2022-08-12 ENCOUNTER — Ambulatory Visit (HOSPITAL_COMMUNITY): Payer: Medicare PPO | Admitting: Internal Medicine

## 2022-09-20 NOTE — Progress Notes (Deleted)
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, retired Optometrist,  never smoker,  followed for OSA,  complicated by HBP, RBBB, peripheral edema, asthma, seasonal allergic rhinitis, morbid obesity NPSG 04/18/06- AHI 30.9/ hr at [facility]  desat to 79%, body weight 225 lbs  ----------------------------------------------------------------------------------    12/30/21- 82 year old male, retired Optometrist,  never smoker,  followed for OSA,  complicated by HTN, RBBB, Pacemaker, peripheral edema, asthma, seasonal allergic rhinitis, morbid obesity -Breztri, ProAir hfa, Singulair,  CPAP auto  8-12 /APS ([employer])   ordered replacement 10/27/21 Download- compliance 100%, AHI 0.4/ hr Body weight today- Covid vax-7 Moderna Flu va- had RSV vax- had PCP Rx'd acute bronchitis last week.- resolved after doxycycline. He returns for confirmation that his replacement CPAP is doing well. Download reviewed.  Continues to feel better after pacemaker placed.  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 HEENT:    headaches, difficulty swallowing, tooth/dental problems, sore throat,       sneezing, itching, ear ache, nasal congestion, post nasal drip, snoring CV:    chest pain, orthopnea, PND, +swelling in lower extremities, anasarca,                                                         dizziness, + palpitations Resp:   + shortness of breath with exertion or at rest.                 productive cough,  + non-productive cough, coughing up of blood.              change in color of mucus.  wheezing.   Skin:    rash or lesions. GI:  No-    heartburn, indigestion, abdominal pain, nausea, vomiting, diarrhea,                 change in bowel habits, loss of appetite GU: dysuria, change in color of urine, no urgency or frequency.   flank pain. MS:   joint pain, stiffness, decreased range of motion, back pain. Neuro-     nothing unusual Psych:  change in mood or affect.  depression or anxiety.   memory loss.  OBJ- Physical Exam General- Alert, Oriented, Affect-appropriate, Distress- none acute, + morbidly obese Skin- rash+ R cheek "5-FU" Lymphadenopathy- none Head- atraumatic            Eyes- Gross vision intact, PERRLA, conjunctivae and secretions clear            Ears- Hearing, canals-normal            Nose- ++ rhinophyma            Throat- Mallampati IV , mucosa clear , drainage- none, tonsils- atrophic Neck- flexible , trachea midline, no stridor , thyroid nl, carotid no bruit Chest - symmetrical excursion , unlabored           Heart/CV- RRR/ pacemaker , no murmur , no gallop  , no rub, nl s1 s2                           -  JVD- none , edema-none, stasis changes+, varices- none           Lung-  clear to P&A, wheeze- none, cough- none , dullness-none, rub- none           Chest wall- +L pacemaker Abd-  Br/ Gen/ Rectal- Not done, not indicated Extrem- +stasis changes legs Neuro- grossly intact to observation

## 2022-09-22 ENCOUNTER — Ambulatory Visit: Payer: Medicare PPO | Admitting: Internal Medicine

## 2022-10-14 ENCOUNTER — Ambulatory Visit (INDEPENDENT_AMBULATORY_CARE_PROVIDER_SITE_OTHER): Payer: Medicare PPO

## 2022-10-14 DIAGNOSIS — I480 Paroxysmal atrial fibrillation: Secondary | ICD-10-CM | POA: Diagnosis not present

## 2022-10-14 LAB — CUP PACEART REMOTE DEVICE CHECK
Date Time Interrogation Session: 20240828092917
Implantable Lead Connection Status: 753985
Implantable Lead Connection Status: 753985
Implantable Lead Implant Date: 20230301
Implantable Lead Implant Date: 20230301
Implantable Lead Location: 753859
Implantable Lead Location: 753860
Implantable Lead Model: 377171
Implantable Lead Model: 377171
Implantable Lead Serial Number: 8000691836
Implantable Lead Serial Number: 8000717006
Implantable Pulse Generator Implant Date: 20230301
Pulse Gen Model: 407145
Pulse Gen Serial Number: 70324575

## 2022-10-16 ENCOUNTER — Other Ambulatory Visit (HOSPITAL_COMMUNITY): Payer: Self-pay | Admitting: Internal Medicine

## 2022-10-23 NOTE — Progress Notes (Signed)
Remote pacemaker transmission.   

## 2022-10-29 ENCOUNTER — Encounter: Payer: Medicare PPO | Admitting: Internal Medicine

## 2022-10-31 NOTE — Progress Notes (Signed)
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 He returns for confirmation that his replacement CPAP is doing well. Download reviewed.  Continues to feel better after pacemaker placed.  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  He feels he is doing quite well with CPAP. Breathing is currently well-controlled using albuterol twice daily and Breztri twice daily.  Medications discussed.  ROS-see HPI   + = positive Constitutional:    +weight loss, night sweats, fevers, chills, fatigue, lassitude. HEENT:    headaches, difficulty swallowing, tooth/dental problems, sore throat,       sneezing, itching, ear ache, nasal congestion, post nasal drip, snoring CV:    chest pain, orthopnea, PND, +swelling in lower extremities, anasarca,                                                         dizziness, + palpitations Resp:   + shortness of  breath with exertion or at rest.                 productive cough,  + non-productive cough, coughing up of blood.              change in color of mucus.  wheezing.   Skin:    rash or lesions. GI:  No-   heartburn, indigestion, abdominal pain, nausea, vomiting, diarrhea,                 change in bowel habits, loss of appetite GU: dysuria, change in color of urine, no urgency or frequency.   flank pain. MS:   joint pain, stiffness, decreased range of motion, back pain. Neuro-     nothing unusual Psych:  change in mood or affect.  depression or anxiety.   memory loss.  OBJ- Physical Exam General- Alert, Oriented, Affect-appropriate, Distress- none acute,  + morbidly obese Skin-  Lymphadenopathy- none Head- atraumatic            Eyes- Gross vision intact, PERRLA, conjunctivae and secretions clear            Ears- Hearing, canals-normal            Nose- ++ rhinophyma            Throat- Mallampati IV , mucosa clear , drainage- none, tonsils- atrophic Neck- flexible , trachea midline, no stridor , thyroid nl, carotid no bruit Chest - symmetrical excursion , unlabored  Heart/CV- RRR/ pacemaker , no murmur , no gallop  , no rub, nl s1 s2                           - JVD- none , edema-none, stasis changes+, varices- none           Lung-  clear to P&A, wheeze- none, cough- none , dullness-none, rub- none           Chest wall- +L pacemaker Abd-  Br/ Gen/ Rectal- Not done, not indicated Extrem- +stasis changes legs Neuro- grossly intact to observation

## 2022-11-02 ENCOUNTER — Ambulatory Visit: Payer: Medicare PPO | Admitting: Internal Medicine

## 2022-11-02 ENCOUNTER — Encounter: Payer: Self-pay | Admitting: Internal Medicine

## 2022-11-02 VITALS — BP 112/60 | HR 75 | Temp 97.6°F | Ht 71.0 in | Wt 242.2 lb

## 2022-11-02 DIAGNOSIS — G4733 Obstructive sleep apnea (adult) (pediatric): Secondary | ICD-10-CM

## 2022-11-02 DIAGNOSIS — J4489 Other specified chronic obstructive pulmonary disease: Secondary | ICD-10-CM

## 2022-11-02 MED ORDER — BREZTRI AEROSPHERE 160-9-4.8 MCG/ACT IN AERO: 2.0000 | INHALATION_SPRAY | Freq: Two times a day (BID) | RESPIRATORY_TRACT | 4 refills | Status: DC

## 2022-11-02 MED ORDER — ALBUTEROL SULFATE HFA 108 (90 BASE) MCG/ACT IN AERS: INHALATION_SPRAY | RESPIRATORY_TRACT | 4 refills | Status: DC

## 2022-11-02 NOTE — Patient Instructions (Signed)
Glad you are doing well. We can continue CPAP auto 8-12  Refills sent for [facility] and your albuterol rescue inhaler

## 2022-11-11 ENCOUNTER — Encounter: Payer: Self-pay | Admitting: Internal Medicine

## 2022-11-11 NOTE — Assessment & Plan Note (Signed)
Moderate persistent uncomplicated. Plan-refill Breztri and albuterol

## 2022-11-11 NOTE — Assessment & Plan Note (Signed)
Effects from CPAP with continued good compliance and control Plan-continue auto 8-12

## 2022-12-09 ENCOUNTER — Ambulatory Visit: Payer: Medicare PPO | Attending: Internal Medicine | Admitting: Internal Medicine

## 2022-12-09 ENCOUNTER — Encounter: Payer: Self-pay | Admitting: Internal Medicine

## 2022-12-09 VITALS — BP 106/62 | HR 81 | Ht 70.0 in | Wt 238.2 lb

## 2022-12-09 DIAGNOSIS — I441 Atrioventricular block, second degree: Secondary | ICD-10-CM | POA: Diagnosis not present

## 2022-12-09 NOTE — Progress Notes (Signed)
HPI Dr. 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 He also has chronic venous insufficiency. He has HTN and remote pvc's. He also has sleep apnea and has been on CPAP. He feels better since his PPM was placed. He denies chest pain or sob. His peripheral edema is improved. He has been maintained on eliquis due to rare episodes of atrial fib with a CVR/RVR. He would like to come off of eliquis due to easy bruisability. No Known Allergies   Current Outpatient Medications  Medication Sig Dispense Refill   acyclovir ointment (ZOVIRAX) 5 % Apply 1 application topically as needed (outbreaks).     albuterol (PROAIR HFA) 108 (90 Base) MCG/ACT inhaler Inhale 2 puffs every 6 hours as needed- rescue 54 g 4   atorvastatin (LIPITOR) 10 MG tablet Take 10 mg by mouth in the morning.  3   Budeson-Glycopyrrol-Formoterol (BREZTRI AEROSPHERE) 160-9-4.8 MCG/ACT AERO Inhale 2 puffs into the lungs in the morning and at bedtime. 32.1 g 4   celecoxib (CELEBREX) 100 MG capsule Take 100 mg by mouth as needed.     diazepam (VALIUM) 10 MG tablet Take 10 mg by mouth as needed for sleep.     ELIQUIS 5 MG TABS tablet TAKE 1 TABLET BY MOUTH TWICE A DAY 60 tablet 3   eplerenone (INSPRA) 50 MG tablet Take 50 mg by mouth 2 (two) times daily.     fluticasone (FLONASE) 50 MCG/ACT nasal spray Place 1 spray into both nostrils as needed.     hydrochlorothiazide (HYDRODIURIL) 25 MG tablet Take 25 mg by mouth daily.     IBSRELA 50 MG TABS Take 50 mg by mouth 2 (two) times daily.     ibuprofen (ADVIL) 200 MG tablet Take 600 mg by mouth as needed (pain).     irbesartan (AVAPRO) 150 MG tablet Take 1 tablet by mouth 2 (two) times daily.  3   LINZESS 72 MCG capsule Take 72 mcg by mouth as needed.     magnesium oxide (MAG-OX) 400 (240 Mg) MG tablet Take 1 tablet (400 mg total) by mouth daily. 30 tablet 3   montelukast (SINGULAIR)  10 MG tablet Take 10 mg by mouth every evening.     Multiple Vitamin (MULTIVITAMIN WITH MINERALS) TABS tablet Take 1 tablet by mouth in the morning.     Multiple Vitamins-Minerals (PRESERVISION AREDS 2 PO) Take 1 tablet by mouth in the morning and at bedtime.     nystatin-triamcinolone (MYCOLOG II) cream Apply topically as needed.     pantoprazole (PROTONIX) 40 MG tablet Take 40 mg by mouth daily. As needed     potassium chloride SA (KLOR-CON M) 20 MEQ tablet Take 20 mEq by mouth daily.     Prucalopride Succinate (MOTEGRITY) 2 MG TABS Take 2 mg by mouth in the morning.     TRULANCE 3 MG TABS Take 3 mg by mouth as needed.     zolpidem (AMBIEN) 5 MG tablet Take 5 mg by mouth at bedtime as needed for sleep. prn     No current facility-administered medications for this visit.     History reviewed. No pertinent past medical history.  ROS:   All systems reviewed and negative except as noted in the HPI.   Past Surgical History:  Procedure Laterality Date   PACEMAKER IMPLANT N/A 04/16/2021   Procedure: PACEMAKER IMPLANT;  Surgeon: Lewayne Bunting  W, MD;  Location: [facility] INVASIVE [facility] LAB;  Service: Cardiovascular;  Laterality: N/A;     History reviewed. No pertinent family history.   Social History   Socioeconomic History   Marital status: Married    Spouse name: Not on file   Number of children: Not on file   Years of education: Not on file   Highest education level: Not on file  Occupational History   Not on file  Tobacco Use   Smoking status: Never   Smokeless tobacco: Never  Vaping Use   Vaping status: Never Used  Substance and Sexual Activity   Alcohol use: Yes    Alcohol/week: 5.0 standard drinks of alcohol    Types: 5 Glasses of wine per week    Comment: occ   Drug use: Never   Sexual activity: Not on file  Other Topics Concern   Not on file  Social History Narrative   Not on file   Social Determinants of Health   Financial Resource Strain: Low Risk  (07/31/2022)    Received from [facility], [facility]   Overall Financial Resource Strain (CARDIA)    Difficulty of Paying Living Expenses: Not hard at all  Food Insecurity: No Food Insecurity (07/31/2022)   Received from [facility], [facility]   Hunger Vital Sign    Worried About Running Out of Food in the Last Year: Never true    Ran Out of Food in the Last Year: Never true  Transportation Needs: No Transportation Needs (07/31/2022)   Received from [facility], [facility]   [facility] - Transportation    Lack of Transportation (Medical): No    Lack of Transportation (Non-Medical): No  Physical Activity: Not on file  Stress: Not on file  Social Connections: Not on file  Intimate Partner Violence: Not At Risk (07/31/2022)   [facility]   Humiliation, Afraid, Rape, and Kick questionnaire    Fear of Current or Ex-Partner: No    Emotionally Abused: No    Physically Abused: No    Sexually Abused: No     BP 106/62   Pulse 81   Ht 5\' 10"  (1.778 m)   Wt 238 lb 3.2 oz (108 kg)   SpO2 97%   BMI 34.18 kg/m   Physical Exam:  elderly appearing NAD HEENT: Unremarkable except for rhinophyma Neck:  6 cm JVD, no thyromegally Lymphatics:  No adenopathy Back:  No CVA tenderness Lungs:  Clear with no wheezes HEART:  Regular rate rhythm, no murmurs, no rubs, no clicks Abd:  soft, positive bowel sounds, no organomegally, no rebound, no guarding Ext:  2 plus pulses, no edema, no cyanosis, no clubbing Skin:  No rashes no nodules Neuro:  CN II through XII intact, motor grossly intact  DEVICE  Normal device function.  See PaceArt for details.   Assess/Plan: Symptomatic high grade AV block - He is doing well after DDD PM insertion. PAF - he would like to consider the Watchman though he is not an ideal candidate. PPM - his Biotronik DDD PM is working normally.  HTN -his bp well controlled off of amlodipine.  Peripheral edema - he has chronic  stasis which is much improved off of the amlodipine.   Ralph Gowda Ridgely Anastacio,MD

## 2022-12-09 NOTE — Patient Instructions (Signed)

## 2022-12-14 ENCOUNTER — Telehealth: Payer: Self-pay

## 2022-12-14 NOTE — Telephone Encounter (Signed)
-----   Message from Rossie Muskrat [facility] sent at 12/10/2022  8:21 PM EDT ----- Sounds good Sharlot Gowda. Happy to see to chat.  Carly and Orpha Bur, Can you get Dr Sharol Harness in to see me in clinic? 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 He has 2:1 AV block, s/p PPM and PAF. He is bruising on eliquis and is wondering about the possibility of Watchman. If you think he would be a candidate, feel free to have your office reach out to him for workup. Sharlot Gowda

## 2022-12-16 NOTE — Telephone Encounter (Signed)
Left message to call back  

## 2022-12-17 LAB — CUP PACEART INCLINIC DEVICE CHECK
Date Time Interrogation Session: 20241023133521
Implantable Lead Connection Status: 753985
Implantable Lead Connection Status: 753985
Implantable Lead Implant Date: 20230301
Implantable Lead Implant Date: 20230301
Implantable Lead Location: 753859
Implantable Lead Location: 753860
Implantable Lead Model: 377171
Implantable Lead Model: 377171
Implantable Lead Serial Number: 8000691836
Implantable Lead Serial Number: 8000717006
Implantable Pulse Generator Implant Date: 20230301
Pulse Gen Model: 407145
Pulse Gen Serial Number: 70324575

## 2022-12-21 NOTE — Telephone Encounter (Signed)
Scheduled the patient for Watchman consult with Dr. Lalla Brothers 03/01/2023. He was grateful for call and agreed with plan.

## 2023-01-05 ENCOUNTER — Emergency Department (HOSPITAL_COMMUNITY): Payer: Medicare PPO

## 2023-01-05 ENCOUNTER — Other Ambulatory Visit: Payer: Self-pay

## 2023-01-05 ENCOUNTER — Emergency Department (HOSPITAL_COMMUNITY)
Admission: EM | Admit: 2023-01-05 | Discharge: 2023-01-06 | Disposition: A | Discharge status: Home / Self Care | Payer: Medicare PPO | Attending: Emergency Medicine | Admitting: Emergency Medicine

## 2023-01-05 DIAGNOSIS — E871 Hypo-osmolality and hyponatremia: Secondary | ICD-10-CM | POA: Diagnosis not present

## 2023-01-05 DIAGNOSIS — Z7901 Long term (current) use of anticoagulants: Secondary | ICD-10-CM | POA: Insufficient documentation

## 2023-01-05 DIAGNOSIS — R008 Other abnormalities of heart beat: Secondary | ICD-10-CM | POA: Diagnosis not present

## 2023-01-05 DIAGNOSIS — R079 Chest pain, unspecified: Secondary | ICD-10-CM | POA: Diagnosis present

## 2023-01-05 NOTE — ED Triage Notes (Signed)
Pt arrives to ED c/o failure to capture with pacemaker. Pt reports noticing HR of mid 30's while at home and called 911. Pt without complaints, no CP, SOB, or N/V at this time. Pt req to have potassium and magnesium checked today

## 2023-01-06 ENCOUNTER — Encounter: Payer: Self-pay | Admitting: Internal Medicine

## 2023-01-06 ENCOUNTER — Telehealth: Payer: Self-pay | Admitting: Internal Medicine

## 2023-01-06 LAB — CBC WITH DIFFERENTIAL/PLATELET
Abs Immature Granulocytes: 0.02 10*3/uL (ref 0.00–0.07)
Basophils Absolute: 0 10*3/uL (ref 0.0–0.1)
Basophils Relative: 1 %
Eosinophils Absolute: 0 10*3/uL (ref 0.0–0.5)
Eosinophils Relative: 1 %
HCT: 38.3 % — ABNORMAL LOW (ref 39.0–52.0)
Hemoglobin: 13.5 g/dL (ref 13.0–17.0)
Immature Granulocytes: 0 %
Lymphocytes Relative: 13 %
Lymphs Abs: 1 10*3/uL (ref 0.7–4.0)
MCH: 31.8 pg (ref 26.0–34.0)
MCHC: 35.2 g/dL (ref 30.0–36.0)
MCV: 90.3 fL (ref 80.0–100.0)
Monocytes Absolute: 0.7 10*3/uL (ref 0.1–1.0)
Monocytes Relative: 10 %
Neutro Abs: 5.3 10*3/uL (ref 1.7–7.7)
Neutrophils Relative %: 75 %
Platelets: 144 10*3/uL — ABNORMAL LOW (ref 150–400)
RBC: 4.24 MIL/uL (ref 4.22–5.81)
RDW: 12.2 % (ref 11.5–15.5)
WBC: 7.1 10*3/uL (ref 4.0–10.5)
nRBC: 0 % (ref 0.0–0.2)

## 2023-01-06 LAB — BASIC METABOLIC PANEL
Anion gap: 11 (ref 5–15)
BUN: 16 mg/dL (ref 8–23)
CO2: 23 mmol/L (ref 22–32)
Calcium: 9 mg/dL (ref 8.9–10.3)
Chloride: 96 mmol/L — ABNORMAL LOW (ref 98–111)
Creatinine, Ser: 1.09 mg/dL (ref 0.61–1.24)
GFR, Estimated: >60 mL/min (ref >60)
Glucose, Bld: 121 mg/dL — ABNORMAL HIGH (ref 70–99)
Potassium: 3.5 mmol/L (ref 3.5–5.1)
Sodium: 130 mmol/L — ABNORMAL LOW (ref 135–145)

## 2023-01-06 LAB — TROPONIN I (HIGH SENSITIVITY)
Troponin I (High Sensitivity): 16 ng/L (ref <18)
Troponin I (High Sensitivity): 17 ng/L (ref <18)

## 2023-01-06 LAB — MAGNESIUM: Magnesium: 1.8 mg/dL (ref 1.7–2.4)

## 2023-01-06 NOTE — Telephone Encounter (Signed)
Patient called stating he was seen the ED yesterday because of low HR, patient has pacemaker.  Patient wants to know if it's safe to travel.  He was advised to follow up with Dr. Ladona Ridgel, I spoke with EP scheduler and she doesn't have anything before tomorrow.  Patient is planning to leave town tomorrow morning for the [place].

## 2023-01-06 NOTE — Telephone Encounter (Signed)
Spoke with pt and gave recommendations from [facility]. Dr Sharol Harness requested I send him a MyChart with Ralph Goodman's name because he was unsure of who he was. Pt was grateful for the help and call.

## 2023-01-06 NOTE — ED Provider Notes (Signed)
[place] EMERGENCY DEPARTMENT AT [facility] Provider Note   CSN: [id number] Arrival date & time: 01/05/23  2309     History  Chief Complaint  Patient presents with   Pacemaker Problem    Ralph Goodman is a 82 y.o. male.  The history is provided by the patient and the spouse.  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 Authorizing Provider  acyclovir ointment (ZOVIRAX) 5 % Apply 1 application topically as needed (outbreaks). 12/25/20   [provider]  albuterol (PROAIR HFA) 108 (90 Base) MCG/ACT inhaler Inhale 2 puffs every 6 hours as needed- rescue 11/02/22   Jetty Duhamel D, MD  atorvastatin (LIPITOR) 10 MG tablet Take 10 mg by mouth in the morning. 05/17/16   [provider]  Budeson-Glycopyrrol-Formoterol (BREZTRI AEROSPHERE) 160-9-4.8 MCG/ACT AERO Inhale 2 puffs into the lungs in the morning and at bedtime. 11/02/22   Jetty Duhamel D, MD  celecoxib (CELEBREX) 100 MG capsule Take 100 mg by mouth as needed. 09/16/21   [provider]  diazepam (VALIUM) 10 MG tablet Take 10 mg by mouth as needed for sleep.    [provider]  ELIQUIS 5 MG TABS tablet TAKE 1 TABLET BY MOUTH TWICE A DAY 10/16/22   Eustace Pen, PA-C  eplerenone (INSPRA) 50 MG tablet Take 50 mg by mouth 2 (two) times daily.    [provider]  fluticasone (FLONASE) 50 MCG/ACT nasal spray Place 1 spray into both  nostrils as needed. 02/27/21   [provider]  hydrochlorothiazide (HYDRODIURIL) 25 MG tablet Take 25 mg by mouth daily.    [provider]  IBSRELA 50 MG TABS Take 50 mg by mouth 2 (two) times daily.    [provider]  ibuprofen (ADVIL) 200 MG tablet Take 600 mg by mouth as needed (pain).    [provider]  irbesartan (AVAPRO) 150 MG tablet Take 1 tablet by mouth 2 (two) times daily. 05/27/16   [provider]  LINZESS 72 MCG capsule Take 72 mcg by mouth as needed. 03/30/22   [provider]  magnesium oxide (MAG-OX) 400 (240 Mg) MG tablet Take 1 tablet (400 mg total) by mouth daily. 04/12/21   Rai, Delene Ruffini, MD  montelukast (SINGULAIR) 10 MG tablet Take 10 mg by mouth every evening. 08/26/13   [provider]  Multiple Vitamin (MULTIVITAMIN WITH MINERALS) TABS tablet Take 1 tablet by mouth in the morning.    [provider]  Multiple Vitamins-Minerals (PRESERVISION AREDS 2 PO) Take 1 tablet by mouth in the morning and at bedtime.    [provider]  nystatin-triamcinolone (MYCOLOG II) cream Apply topically as needed.    [provider]  pantoprazole (PROTONIX) 40 MG tablet Take 40 mg by mouth daily. As needed 04/14/16   [provider]  potassium chloride SA (KLOR-CON M) 20 MEQ tablet Take 20 mEq by mouth daily.    [provider]  Prucalopride Succinate (MOTEGRITY) 2 MG TABS Take 2 mg by mouth in the morning.    [provider]  TRULANCE 3 MG TABS Take 3 mg by mouth as needed. 05/22/22   [provider]  zolpidem (AMBIEN) 5 MG tablet Take 5 mg by mouth at bedtime as needed for sleep. prn 01/29/21   [provider]      Allergies    Patient has no known allergies.    Review of Systems   Review of Systems  Constitutional:  Negative for fever.  Respiratory:  Negative for cough, shortness of breath and wheezing.   Cardiovascular:  Negative for chest pain.     Physical Exam Updated Vital Signs BP (!) 140/75   Pulse 95   Temp 98.9 F (37.2 C) (Oral)   Resp 12   Ht 5\' 10"  (1.778 m)   Wt 108 kg   SpO2 99%   BMI 34.16 kg/m  Physical Exam Vitals and nursing note reviewed. Exam conducted with a chaperone present.  Constitutional:      General: He is not in acute distress.    Appearance: He is well-developed. He is not diaphoretic.  HENT:     Head: Normocephalic and atraumatic.     Nose: Nose normal.  Eyes:     Conjunctiva/sclera: Conjunctivae normal.     Pupils: Pupils are equal, round, and reactive to light.  Cardiovascular:     Rate and Rhythm: Normal rate and regular rhythm.  Pulmonary:     Effort: Pulmonary effort is normal.     Breath sounds: Normal breath sounds. No wheezing or rales.  Abdominal:     General: Bowel sounds are normal.     Palpations: Abdomen is soft.     Tenderness: There is no abdominal tenderness. There is no guarding or rebound.  Musculoskeletal:        General: Normal range of motion.     Cervical back: Normal range of motion and neck supple.  Skin:    General: Skin is warm and dry.     Capillary Refill: Capillary refill takes less than 2 seconds.  Neurological:     General: No focal deficit present.     Mental Status: He is alert and oriented to person, place, and time.     Deep Tendon Reflexes: Reflexes normal.  Psychiatric:        Thought Content: Thought content normal.     ED Results / Procedures / Treatments   Labs (all labs ordered are listed, but only abnormal results are displayed) Results for orders placed or performed during the hospital encounter of 01/05/23  CBC with Differential  Result Value Ref Range   WBC 7.1 4.0 - 10.5 K/uL   RBC 4.24 4.22 - 5.81 MIL/uL   Hemoglobin 13.5 13.0 - 17.0 g/dL   HCT 16.1 (L) 09.6 - 04.5 %   MCV 90.3 80.0 - 100.0 fL   MCH 31.8 26.0 - 34.0 pg   MCHC 35.2 30.0 - 36.0 g/dL   RDW 40.9 81.1 - 91.4 %   Platelets 144 (L) 150 - 400 K/uL   nRBC 0.0  0.0 - 0.2 %   Neutrophils Relative % 75 %   Neutro Abs 5.3 1.7 - 7.7 K/uL   Lymphocytes Relative 13 %   Lymphs Abs 1.0 0.7 - 4.0 K/uL   Monocytes Relative 10 %   Monocytes Absolute 0.7 0.1 - 1.0 K/uL   Eosinophils Relative 1 %   Eosinophils Absolute 0.0 0.0 -  0.5 K/uL   Basophils Relative 1 %   Basophils Absolute 0.0 0.0 - 0.1 K/uL   Immature Granulocytes 0 %   Abs Immature Granulocytes 0.02 0.00 - 0.07 K/uL  Basic metabolic panel  Result Value Ref Range   Sodium 130 (L) 135 - 145 mmol/L   Potassium 3.5 3.5 - 5.1 mmol/L   Chloride 96 (L) 98 - 111 mmol/L   CO2 23 22 - 32 mmol/L   Glucose, Bld 121 (H) 70 - 99 mg/dL   BUN 16 8 - 23 mg/dL   Creatinine, Ser 1.61 0.61 - 1.24 mg/dL   Calcium 9.0 8.9 - 09.6 mg/dL   GFR, Estimated >04 >54 mL/min   Anion gap 11 5 - 15  Magnesium  Result Value Ref Range   Magnesium 1.8 1.7 - 2.4 mg/dL  Troponin I (High Sensitivity)  Result Value Ref Range   Troponin I (High Sensitivity) 16 <18 ng/L  Troponin I (High Sensitivity)  Result Value Ref Range   Troponin I (High Sensitivity) 17 <18 ng/L   DG Chest Portable 1 View  Result Date: 01/06/2023 CLINICAL DATA:  Pacemaker problem. EXAM: PORTABLE CHEST 1 VIEW COMPARISON:  04/16/2021. FINDINGS: The heart is enlarged and mediastinal contours are within normal limits. There is atherosclerotic calcification of the aorta. Stable atelectasis or scarring is noted at the lung bases. No effusion or pneumothorax is seen. A dual lead pacemaker device is present over the left chest. No acute osseous abnormality. IMPRESSION: Stable chest with no active disease. Electronically Signed   By: Thornell Sartorius M.D.   On: 01/06/2023 01:48   CUP PACEART INCLINIC DEVICE CHECK  Result Date: 12/17/2022 Pacemaker check in clinic. Normal device function. Thresholds, sensing, impedances consistent with previous measurements. Device programmed to maximize longevity. No mode switch or high ventricular rates noted. Device programmed  at appropriate safety margins. Histogram distribution appropriate for patient activity level. Device programmed to optimize intrinsic conduction. Estimated longevity 10.5 years. Patient enrolled in remote follow-up. 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 EXAM: PORTABLE CHEST 1 VIEW COMPARISON:  04/16/2021. FINDINGS: The heart is enlarged and mediastinal contours are within normal limits. There is atherosclerotic calcification of the aorta. Stable atelectasis or scarring is noted at the lung bases. No effusion or pneumothorax is seen. A dual lead pacemaker device is present over the left chest. No acute osseous abnormality. IMPRESSION: Stable chest with no active disease. Electronically Signed   By: Thornell Sartorius M.D.   On: 01/06/2023 01:48    Procedures Procedures    Medications Ordered in ED Medications - No data to display  ED Course/ Medical Decision Making/ A&P                                 Medical Decision Making Patient with pacemaker who had observed low HR at home this evening   Amount and/or Complexity of Data Reviewed Independent Historian: spouse    Details: See above  External Data Reviewed: labs and notes.    Details:  Previous cardiology notes reviewed, previous troponins and sodium and magnesium levels reviewed  Labs: ordered.    Details: Normal white count 7.1, normal hemoglobin 13.5, platelets slightly low 144 ( but patient has been this low in the past).  Sodium low 130 (stable), normal potassium 3.5, normal  magnesium 1.8, normal creatinine 1.09, normal troponins 16 and 17 (below patient's baseline) Radiology: ordered and independent interpretation performed.    Details: NO CHF by me on CXR ECG/medicine tests: ordered and independent interpretation performed. Decision-making details documented in ED Course. Discussion of management or test interpretation with external provider(s): Case d/w Dr. Lily Peer of cardiology.  He will see the patient when pace maker interrogation is complete   Risk Risk Details: Patient has been seen by cardiology, Dr. Lily Peer.  See his note separately.  Pacemaker interrogation demonstrates that is functioning appropriately.  Sodium is slightly low at 130 but is near patient's baseline.  2 negative troponins in the ED.  Patient is very well appearing and would like to go home.  This is very reasonable.  I have advised speaking to Dr. Ladona Ridgel about whether he is comfortable with the patient traveling for the holiday.  Stable for discharge with close follow up.      Final Clinical Impression(s) / ED Diagnoses Final diagnoses:  Ventricular bigeminy seen on cardiac monitor  Hyponatremia    I have reviewed the triage vital signs and the nursing notes. Pertinent labs & imaging results that were available during my care of the patient were reviewed by me and considered in my medical decision making (see chart for details). After history, exam, and medical workup I feel the patient has been appropriately medically screened and is safe for discharge home. Pertinent diagnoses were discussed with the patient. Patient was given return precautions.  Rx / DC Orders ED Discharge Orders     None         Ilda Laskin, MD 01/06/23 [phone]

## 2023-01-06 NOTE — Consult Note (Signed)
Cardiology Consultation   Patient ID: BRAYLEN HARBOTTLE MRN: [id number]; DOB: March 21, 1940  Admit date: 01/05/2023 Date of Consult: 01/06/2023  PCP:  Thea Alken, MD   [facility] Providers Cardiologist:  None        Patient Profile:   Ralph Goodman is a 82 y.o. 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  History of Present Illness:    Ralph Goodman is a 82 y.o. 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  Pt reports he checked pulse ox and BP cuff- both showed low HR in 30s, and then checked Kardia mobile- said afib and low HR- thus came to the hospital for check up. Denies any chest pain, falls, syncope or pre syncope or CHF symptoms. Just saw Dr Ladona Ridgel on 10/23- was doing well. He has rare episodes of afib, maintained on eliquis-> Ep referral sent for watchman consideration. Last ECHO 2023- normal EF.  EKG- NSR with bigeminy Tele- no bradyarrhtyhmias PPM interrogated- normal working, Vp 95% Initial labs including trop is negative except chronic hyponatremia    No past medical history on file.  Past Surgical History:  Procedure Laterality Date   PACEMAKER IMPLANT N/A 04/16/2021   Procedure: PACEMAKER IMPLANT;  Surgeon: Marinus Maw, MD;  Location: [facility] INVASIVE CV LAB;  Service: Cardiovascular;  Laterality: N/A;     Home Medications:  Prior to Admission medications   Medication Sig Start Date End Date Taking? Authorizing Provider  acyclovir ointment (ZOVIRAX) 5 % Apply 1 application topically as needed (outbreaks). 12/25/20   [provider]  albuterol (PROAIR HFA) 108 (90 Base) MCG/ACT inhaler Inhale 2 puffs every 6 hours as needed- rescue 11/02/22    Jetty Duhamel D, MD  atorvastatin (LIPITOR) 10 MG tablet Take 10 mg by mouth in the morning. 05/17/16   [provider]  Budeson-Glycopyrrol-Formoterol (BREZTRI AEROSPHERE) 160-9-4.8 MCG/ACT AERO Inhale 2 puffs into the lungs in the morning and at bedtime. 11/02/22   Jetty Duhamel D, MD  celecoxib (CELEBREX) 100 MG capsule Take 100 mg by mouth as needed. 09/16/21   [provider]  diazepam (VALIUM) 10 MG tablet Take 10 mg by mouth as needed for sleep.    [provider]  ELIQUIS 5 MG TABS tablet TAKE 1 TABLET BY MOUTH TWICE A DAY 10/16/22   Eustace Pen, PA-C  eplerenone (INSPRA) 50 MG tablet Take 50 mg by mouth 2 (two) times daily.    [provider]  fluticasone (FLONASE) 50 MCG/ACT nasal spray Place 1 spray into both nostrils as needed. 02/27/21   [provider]  hydrochlorothiazide (HYDRODIURIL) 25 MG tablet Take 25 mg by mouth daily.    [provider]  IBSRELA 50 MG TABS Take 50 mg by mouth 2 (two) times daily.    [provider]  ibuprofen (ADVIL) 200 MG tablet Take 600 mg by mouth as needed (pain).    [provider]  irbesartan (AVAPRO) 150 MG tablet Take 1 tablet by mouth 2 (two) times daily. 05/27/16   [provider]  LINZESS 72 MCG capsule Take 72 mcg by mouth as needed. 03/30/22   [provider]  magnesium  oxide (MAG-OX) 400 (240 Mg) MG tablet Take 1 tablet (400 mg total) by mouth daily. 04/12/21   Rai, Delene Ruffini, MD  montelukast (SINGULAIR) 10 MG tablet Take 10 mg by mouth every evening. 08/26/13   [provider]  Multiple Vitamin (MULTIVITAMIN WITH MINERALS) TABS tablet Take 1 tablet by mouth in the morning.    [provider]  Multiple Vitamins-Minerals (PRESERVISION AREDS 2 PO) Take 1 tablet by mouth in the morning and at bedtime.    [provider]  nystatin-triamcinolone (MYCOLOG II) cream Apply topically as needed.    [provider]  pantoprazole  (PROTONIX) 40 MG tablet Take 40 mg by mouth daily. As needed 04/14/16   [provider]  potassium chloride SA (KLOR-CON M) 20 MEQ tablet Take 20 mEq by mouth daily.    [provider]  Prucalopride Succinate (MOTEGRITY) 2 MG TABS Take 2 mg by mouth in the morning.    [provider]  TRULANCE 3 MG TABS Take 3 mg by mouth as needed. 05/22/22   [provider]  zolpidem (AMBIEN) 5 MG tablet Take 5 mg by mouth at bedtime as needed for sleep. prn 01/29/21   [provider]    Inpatient Medications: Scheduled Meds:  Continuous Infusions:  PRN Meds:   Allergies:   No Known Allergies  Social History:   Social History   Socioeconomic History   Marital status: Married    Spouse name: Not on file   Number of children: Not on file   Years of education: Not on file   Highest education level: Not on file  Occupational History   Not on file  Tobacco Use   Smoking status: Never   Smokeless tobacco: Never  Vaping Use   Vaping status: Never Used  Substance and Sexual Activity   Alcohol use: Yes    Alcohol/week: 5.0 standard drinks of alcohol    Types: 5 Glasses of wine per week    Comment: occ   Drug use: Never   Sexual activity: Not on file  Other Topics Concern   Not on file  Social History Narrative   Not on file   Social Determinants of Health   Financial Resource Strain: Low Risk  (07/31/2022)   Received from [facility], [facility]   Overall Financial Resource Strain (CARDIA)    Difficulty of Paying Living Expenses: Not hard at all  Food Insecurity: No Food Insecurity (07/31/2022)   Received from [facility], [facility]   Hunger Vital Sign    Worried About Running Out of Food in the Last Year: Never true    Ran Out of Food in the Last Year: Never true  Transportation Needs: No Transportation Needs (07/31/2022)   Received from [facility], [facility]   [facility] - Transportation    Lack of Transportation  (Medical): No    Lack of Transportation (Non-Medical): No  Physical Activity: Not on file  Stress: Not on file  Social Connections: Not on file  Intimate Partner Violence: Not At Risk (07/31/2022)   Received from [facility], [facility]   Humiliation, Afraid, Rape, and Kick questionnaire    Fear of Current or Ex-Partner: No    Emotionally Abused: No    Physically Abused: No    Sexually Abused: No    Family History:   No family history on file.   ROS:  Please see the history of present illness.   All  other ROS reviewed and negative.     Physical Exam/Data:   Vitals:   01/05/23 2312 01/05/23 2315 01/06/23 0100  BP: (!) 135/57  (!) 140/75  Pulse: 75  95  Resp: 16  12  Temp: 98.9 F (37.2 C)    TempSrc: Oral    SpO2: 100%  99%  Weight:  108 kg   Height:  5\' 10"  (1.778 m)    No intake or output data in the 24 hours ending 01/06/23 0203    01/05/2023   11:15 PM 12/09/2022    3:35 PM 11/02/2022    3:26 PM  Last 3 Weights  Weight (lbs) 238 lb 1.6 oz 238 lb 3.2 oz 242 lb 3.2 oz  Weight (kg) 108 kg 108.047 kg 109.861 kg     Body mass index is 34.16 kg/m.  General:  Well nourished, well developed, in no acute distress HEENT: normal Neck: no JVD Vascular: No carotid bruits; Distal pulses 2+ bilaterally Cardiac:  normal S1, S2; RRR; no murmur  Lungs:  clear to auscultation bilaterally, no wheezing, rhonchi or rales  Abd: soft, nontender, no hepatomegaly  Ext: no edema Musculoskeletal:  No deformities, BUE and BLE strength normal and equal Skin: warm and dry  Neuro:  CNs 2-12 intact, no focal abnormalities noted Psych:  Normal affect   EKG:  The EKG was personally reviewed and demonstrates:  NSR with PVCs, bigeminy Telemetry:  Telemetry was personally reviewed and demonstrates:  NSR  Relevant CV Studies: As noted above  Laboratory Data:  High Sensitivity Troponin:   Recent Labs  Lab 01/05/23 0014  TROPONINIHS 16     Chemistry Recent Labs  Lab  01/05/23 0014  NA 130*  K 3.5  CL 96*  CO2 23  GLUCOSE 121*  BUN 16  CREATININE 1.09  CALCIUM 9.0  MG 1.8  GFRNONAA >60  ANIONGAP 11    No results for input(s): "PROT", "ALBUMIN", "AST", "ALT", "ALKPHOS", "BILITOT" in the last 168 hours. Lipids No results for input(s): "CHOL", "TRIG", "HDL", "LABVLDL", "LDLCALC", "CHOLHDL" in the last 168 hours.  Hematology Recent Labs  Lab 01/05/23 0014  WBC 7.1  RBC 4.24  HGB 13.5  HCT 38.3*  MCV 90.3  MCH 31.8  MCHC 35.2  RDW 12.2  PLT 144*   Thyroid No results for input(s): "TSH", "FREET4" in the last 168 hours.  BNPNo results for input(s): "BNP", "PROBNP" in the last 168 hours.  DDimer No results for input(s): "DDIMER" in the last 168 hours.   Radiology/Studies:  DG Chest Portable 1 View  Result Date: 01/06/2023 CLINICAL DATA:  Pacemaker problem. EXAM: PORTABLE CHEST 1 VIEW COMPARISON:  04/16/2021. FINDINGS: The heart is enlarged and mediastinal contours are within normal limits. There is atherosclerotic calcification of the aorta. Stable atelectasis or scarring is noted at the lung bases. No effusion or pneumothorax is seen. A dual lead pacemaker device is present over the left chest. No acute osseous abnormality. IMPRESSION: Stable chest with no active disease. Electronically Signed   By: Thornell Sartorius M.D.   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 Afib, burden I slow   Pacemaker interrogated- Vp 95%, working fine. CXR- leads fine. Pt exam is benign, reassured. Wife confirmed now that Kardia mobile shows HR was 77bpm (NOT 30s). I suspect pulse ox did not pick up PVCs and artifactually  gave low HR.  - no further recs - f/w outpatient in device clinic routinely. - patient wants to go home, I don't see any CI from my standpoint. ER can discharge him if all labs and  other work up are negative    Risk Assessment/Risk Scores:            [facility] will sign off.   Medication Recommendations:  no changes Follow up as an outpatient:  EP  For questions or updates, please contact [facility] Please consult [email or web address] for contact info under    Signed, Elmon Kirschner, MD  01/06/2023 2:03 AM

## 2023-01-13 ENCOUNTER — Ambulatory Visit (INDEPENDENT_AMBULATORY_CARE_PROVIDER_SITE_OTHER): Payer: Medicare PPO

## 2023-01-13 DIAGNOSIS — I441 Atrioventricular block, second degree: Secondary | ICD-10-CM

## 2023-01-13 LAB — CUP PACEART REMOTE DEVICE CHECK
Battery Voltage: 85
Date Time Interrogation Session: 20241127081814
Implantable Lead Connection Status: 753985
Implantable Lead Connection Status: 753985
Implantable Lead Implant Date: 20230301
Implantable Lead Implant Date: 20230301
Implantable Lead Location: 753859
Implantable Lead Location: 753860
Implantable Lead Model: 377171
Implantable Lead Model: 377171
Implantable Lead Serial Number: 8000691836
Implantable Lead Serial Number: 8000717006
Implantable Pulse Generator Implant Date: 20230301
Pulse Gen Model: 407145
Pulse Gen Serial Number: 70324575

## 2023-01-25 ENCOUNTER — Encounter: Payer: Medicare PPO | Admitting: Internal Medicine

## 2023-02-16 NOTE — Progress Notes (Signed)
Remote pacemaker transmission.   

## 2023-03-01 ENCOUNTER — Ambulatory Visit: Payer: Medicare PPO | Admitting: Cardiology

## 2023-03-01 NOTE — Progress Notes (Deleted)
 Electrophysiology Office Follow up Visit Note:    Date:  03/01/2023   ID:  Ralph Goodman, DOB 03-13-1940, MRN [id number]  PCP:  Layman Debby BROCKS, MD  [facility] Cardiologist:  None  [facility] Electrophysiologist:  OLE ONEIDA HOLTS, MD    Interval History:     Ralph Goodman is a 83 y.o. male who presents for a follow up visit.   Dr. Shed follows with Dr. 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  He has significant bruising on Eliquis  and is concerned about the long-term risks of bleeding associated with indefinite anticoagulant use.  He is interested in pursuing left atrial appendage occlusion.        Past medical, surgical, social and family history were reviewed.  ROS:   Please see the history of present illness.    All other systems reviewed and are negative.  EKGs/Labs/Other Studies Reviewed:    The following studies were reviewed today:  March 01, 2023 in clinic device interrogation personally reviewed ***   January 06, 2023 EKG reviewed and shows sinus rhythm with bigeminal PACs.    April 12, 2021 echo EF 60 RV moderately reduced Severely dilated left atrium Trivial MR          Physical Exam:    VS:  There were no vitals taken for this visit.    Wt Readings from Last 3 Encounters:  01/05/23 238 lb 1.6 oz (108 kg)  12/09/22 238 lb 3.2 oz (108 kg)  11/02/22 242 lb 3.2 oz (109.9 kg)     GEN: no distress CARD: RRR, No MRG.  Pacemaker pocket well-healed RESP: No IWOB. CTAB.      ASSESSMENT:    No diagnosis found. PLAN:    In order of problems listed above:  #Paroxysmal atrial fibrillation Low burden.  Continues to take Eliquis  for stroke prophylaxis although he prefers a long-term stroke mitigation strategy that avoids indefinite exposure to anticoagulation.  I  discussed the role of left atrial appendage occlusion and stroke risk reduction.  I discussed the Watchman procedure including its details and likelihood of success.  ---------------  I have seen Ralph Goodman in the office today who is being considered for a Watchman left atrial appendage closure device. I believe they will benefit from this procedure given their history of atrial fibrillation, CHA2DS2-VASc score of 3 and unadjusted ischemic stroke rate of 3.2% per year. The patient's chart has been reviewed and I feel that they would be a candidate for short term oral anticoagulation after Watchman implant.   It is my belief that after undergoing a LAA closure procedure, Ralph Goodman will not need long term anticoagulation which eliminates anticoagulation side effects and major bleeding risk.   Procedural risks for the Watchman implant have been reviewed with the patient including a 0.5% risk of stroke, <1% risk of perforation and <1% risk of device embolization. Other risks include bleeding, vascular damage, tamponade, worsening renal function, and death. The patient understands these risk and wishes to proceed.     The published clinical data on the safety and effectiveness of WATCHMAN include but are not limited to the following: - Holmes DR, Jess BEARD, Sick P et al. for the PROTECT AF Investigators. Percutaneous closure of the left atrial appendage versus warfarin therapy for prevention of stroke in patients with atrial fibrillation: a randomised non-inferiority trial.  Lancet 2009; 374: 534-42. GLENWOOD Jess BEARD, Doshi SK, Jonita VEAR Satchel D et al. on behalf of the PROTECT AF Investigators. Percutaneous Left Atrial Appendage Closure for Stroke Prophylaxis in Patients With Atrial Fibrillation 2.3-Year Follow-up of the PROTECT AF (Watchman Left Atrial Appendage System for Embolic Protection in Patients With Atrial Fibrillation) Trial. Circulation 2013; 127:720-729. - Alli O, Doshi S,  Kar S,  Reddy VY, Sievert H et al. Quality of Life Assessment in the Randomized PROTECT AF (Percutaneous Closure of the Left Atrial Appendage Versus Warfarin Therapy for Prevention of Stroke in Patients With Atrial Fibrillation) Trial of Patients at Risk for Stroke With Nonvalvular Atrial Fibrillation. J Am Coll Cardiol 2013; 61:1790-8. GLENWOOD Satchel DR, Archer RAMAN, Price M, Whisenant B, Sievert H, Doshi S, Huber K, Reddy V. Prospective randomized evaluation of the Watchman left atrial appendage Device in patients with atrial fibrillation versus long-term warfarin therapy; the PREVAIL trial. Journal of the [employer] of Cardiology, Vol. 4, No. 1, 2014, 1-11. - Kar S, Doshi SK, Sadhu A, Horton R, Osorio J et al. Primary outcome evaluation of a next-generation left atrial appendage closure device: results from the PINNACLE FLX trial. Circulation 2021;143(18)1754-1762.    After today's visit with the patient which was dedicated solely for shared decision making visit regarding LAA closure device, the patient decided to proceed with the LAA appendage closure procedure scheduled to be done in the near future at [facility]. Prior to the procedure, I would like to obtain a gated CT scan of the chest with contrast timed for PV/LA visualization.   Additionally, the patient will need an updated echo.  HAS-BLED score 2 Hypertension Yes  Abnormal renal and liver function (Dialysis, transplant, Cr >2.26 mg/dL /Cirrhosis or Bilirubin >2x Normal or AST/ALT/AP >3x Normal) No  Stroke No  Bleeding No  Labile INR (Unstable/high INR) No  Elderly (>65) Yes  Drugs or alcohol (>= 8 drinks/week, anti-plt or NSAID) No   CHA2DS2-VASc Score = 3  The patient's score is based upon: CHF History: 0 HTN History: 1 Diabetes History: 0 Stroke History: 0 Vascular Disease History: 0 Age Score: 2 Gender Score: 0   Signed, Ole Holts, MD, [facility], [facility] 03/01/2023 10:25 AM    Electrophysiology [facility]

## 2023-03-12 ENCOUNTER — Encounter: Payer: Self-pay | Admitting: Internal Medicine

## 2023-03-12 ENCOUNTER — Other Ambulatory Visit: Payer: Self-pay

## 2023-03-12 MED ORDER — APIXABAN 5 MG PO TABS: 5.0000 mg | ORAL_TABLET | Freq: Two times a day (BID) | ORAL | 5 refills | Status: AC

## 2023-03-12 NOTE — Telephone Encounter (Signed)
Prescription refill request for Eliquis received. Indication:afib Last office visit:10/24 Scr:1.09  11/24 Age: 83 Weight:108  kg  Prescription refilled

## 2023-03-16 ENCOUNTER — Institutional Professional Consult (permissible substitution): Payer: Medicare PPO | Admitting: Plastic Surgery

## 2023-04-01 ENCOUNTER — Encounter: Payer: Self-pay | Admitting: Internal Medicine

## 2023-04-01 ENCOUNTER — Ambulatory Visit: Payer: Medicare PPO | Attending: Cardiology | Admitting: Cardiology

## 2023-04-01 ENCOUNTER — Encounter: Payer: Self-pay | Admitting: Cardiology

## 2023-04-01 VITALS — BP 120/52 | HR 60 | Ht 71.0 in | Wt 236.4 lb

## 2023-04-01 DIAGNOSIS — Z950 Presence of cardiac pacemaker: Secondary | ICD-10-CM | POA: Diagnosis not present

## 2023-04-01 DIAGNOSIS — I480 Paroxysmal atrial fibrillation: Secondary | ICD-10-CM

## 2023-04-01 DIAGNOSIS — G4733 Obstructive sleep apnea (adult) (pediatric): Secondary | ICD-10-CM

## 2023-04-01 DIAGNOSIS — I441 Atrioventricular block, second degree: Secondary | ICD-10-CM | POA: Diagnosis not present

## 2023-04-01 NOTE — Progress Notes (Signed)
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 The patient has a history of AF, HTN, heart block with a dual chamber PPM in situ, OSA on CPAP. He is very interested in stopping his anticoagulant and is interested in the [place] procedure.     Their past medical, social and family history was reviewed.   ROS:   Please see the history of present illness.    All other systems reviewed and are negative.  EKGs/Labs/Other Studies Reviewed:    The following studies were reviewed today:  04/01/2023 in clinic device interrogation personally reviewed Battery longevity 10 years Lead parameters stable Last prolonged episode of AF 07/01/2022 lasting > 20 hours A paces 34% V paces 96%  04/12/2021 Echo EF 60 RV moderaly reduced LA severely dilated Trivial MR         Physical Exam:    VS:  BP (!) 120/52   Pulse 60   Ht 5\' 11"  (1.803 m)   Wt 236 lb 6.4 oz (107.2 kg)   SpO2 99%   BMI 32.97 kg/m     Wt Readings from Last 3 Encounters:  04/01/23 236 lb 6.4 oz (107.2 kg)  01/05/23 238 lb 1.6 oz (108 kg)  12/09/22 238 lb 3.2 oz (108 kg)     GEN: no distress. Obese. CARD: RRR, No MRG. CIED pocket well healed RESP: No IWOB. CTAB.        ASSESSMENT AND PLAN:    1. AV block, Mobitz 2   2. Paroxysmal atrial fibrillation (HCC)   3. Pacemaker    #AV block #Symptomatic bradycardia #PPM in situ The patient has bradycardia with a DDD PPM in situ. PPM functioning appropriately. Will continue remote monitoring.  #Paroxysmal AF Low burden. Currently on eliquis but prefers a stroke risk mitigation strategy  avoiding long term exposure to [facility]. I discussed his risk of stroke during today's appointment. We discussed OAC and LAAO for stroke risk mitigation. He is going to think about his options and let us know if he would like to proceed with Watchman implant.   --------------------------  I have seen Ralph Goodman in the office today who is being considered for a Watchman left atrial appendage closure device. I believe they will benefit from this procedure given their history of atrial fibrillation, CHA2DS2-VASc score of 3 and unadjusted ischemic stroke rate of 3.2% per year. The patient's chart has been reviewed and I feel that they would be a candidate for short term oral anticoagulation after Watchman implant.   It is my belief that after undergoing a LAA closure procedure, Ralph Goodman will not need long term anticoagulation which eliminates anticoagulation side effects and major bleeding risk.   Procedural risks for the Watchman implant have been reviewed with the patient including a 0.5% risk of stroke, <1% risk of perforation and <1% risk of device embolization. Other risks include bleeding, vascular damage, tamponade, worsening renal function, and death. The patient understands these risks.   The published clinical data on the safety and effectiveness of WATCHMAN include but are not limited to the following: -  Holmes DR, Everlene Farrier, Sick P et al. for the PROTECT AF Investigators. Percutaneous closure of the left atrial appendage versus warfarin therapy for prevention of stroke in patients with atrial fibrillation: a randomised non-inferiority trial. Lancet 2009; 374: 534-42. Everlene Farrier, Doshi SK, Isa Rankin D et al. on behalf of the PROTECT AF Investigators. Percutaneous Left Atrial Appendage Closure for Stroke Prophylaxis in Patients With Atrial Fibrillation 2.3-Year Follow-up of the PROTECT AF (Watchman Left Atrial Appendage System for Embolic Protection in Patients With Atrial  Fibrillation) Trial. Circulation 2013; 127:720-729. - Alli O, Doshi S,  Kar S, Reddy VY, Sievert H et al. Quality of Life Assessment in the Randomized PROTECT AF (Percutaneous Closure of the Left Atrial Appendage Versus Warfarin Therapy for Prevention of Stroke in Patients With Atrial Fibrillation) Trial of Patients at Risk for Stroke With Nonvalvular Atrial Fibrillation. J Am Coll Cardiol 2013; 61:1790-8. Aline August DR, [place], Price M, Whisenant B, Sievert H, Doshi S, Huber K, Reddy V. Prospective randomized evaluation of the Watchman left atrial appendage Device in patients with atrial fibrillation versus long-term warfarin therapy; the PREVAIL trial. Journal of the [employer] of Cardiology, Vol. 4, No. 1, 2014, 1-11. - Kar S, Doshi SK, Sadhu A, Horton R, Osorio J et al. Primary outcome evaluation of a next-generation left atrial appendage closure device: results from the PINNACLE FLX trial. Circulation 2021;143(18)1754-1762.    After today's visit with the patient which was dedicated solely for shared decision making visit regarding LAA closure device, the patient decided to think about his options before finalizing a decision re: LAA appendage closure If he elects to proceed, I would like to obtain a gated CT scan of the chest with contrast timed for PV/LA visualization.   HAS-BLED score 2 Hypertension Yes  Abnormal renal and liver function (Dialysis, transplant, Cr >2.26 mg/dL /Cirrhosis or Bilirubin >2x Normal or AST/ALT/AP >3x Normal) No  Stroke No  Bleeding No  Labile INR (Unstable/high INR) No  Elderly (>65) Yes  Drugs or alcohol (>= 8 drinks/week, anti-plt or NSAID) No   CHA2DS2-VASc Score = 3  The patient's score is based upon: CHF History: 0 HTN History: 1 Diabetes History: 0 Stroke History: 0 Vascular Disease History: 0 Age Score: 2 Gender Score: 0         Signed, Sheria Lang T. Lalla Brothers, MD, [facility], [facility] 04/01/2023 8:38 PM    Electrophysiology [facility]

## 2023-04-01 NOTE — Patient Instructions (Addendum)
Medication Instructions:  Your physician recommends that you continue on your current medications as directed. Please refer to the Current Medication list given to you today.  *If you need a refill on your cardiac medications before your next appointment, please call your pharmacy*  Testing/Procedures: Watchman Your physician has requested that you have Left atrial appendage (LAA) closure device implantation is a procedure to put a small device in the LAA of the heart. The LAA is a small sac in the wall of the heart's left upper chamber. Blood clots can form in this area. The device, Watchman closes the LAA to help prevent a blood clot and stroke.   Follow-Up: At [facility], you and your health needs are our priority.  As part of our continuing mission to provide you with exceptional heart care, we have created designated Provider Care Teams.  These Care Teams include your primary Cardiologist (physician) and Advanced Practice Providers (APPs -  Physician Assistants and Nurse Practitioners) who all work together to provide you with the care you need, when you need it.  Your next appointment:   Please contact Nurse Navigator, [place] at [phone] if you would like to proceed with Watchman procedure.

## 2023-04-14 ENCOUNTER — Ambulatory Visit (INDEPENDENT_AMBULATORY_CARE_PROVIDER_SITE_OTHER): Payer: Medicare PPO

## 2023-04-14 DIAGNOSIS — I441 Atrioventricular block, second degree: Secondary | ICD-10-CM | POA: Diagnosis not present

## 2023-04-15 LAB — CUP PACEART REMOTE DEVICE CHECK
Battery Voltage: 85
Date Time Interrogation Session: 20250226091733
Implantable Lead Connection Status: 753985
Implantable Lead Connection Status: 753985
Implantable Lead Implant Date: 20230301
Implantable Lead Implant Date: 20230301
Implantable Lead Location: 753859
Implantable Lead Location: 753860
Implantable Lead Model: 377171
Implantable Lead Model: 377171
Implantable Lead Serial Number: 8000691836
Implantable Lead Serial Number: 8000717006
Implantable Pulse Generator Implant Date: 20230301
Pulse Gen Model: 407145
Pulse Gen Serial Number: 70324575

## 2023-04-18 ENCOUNTER — Encounter: Payer: Self-pay | Admitting: Internal Medicine

## 2023-05-18 NOTE — Progress Notes (Signed)
 Remote pacemaker transmission.

## 2023-05-18 NOTE — Addendum Note (Signed)
 Addended by: Elease Etienne A on: 05/18/2023 12:26 PM   Modules accepted: Orders

## 2023-06-14 ENCOUNTER — Other Ambulatory Visit (HOSPITAL_BASED_OUTPATIENT_CLINIC_OR_DEPARTMENT_OTHER): Payer: Self-pay | Admitting: Internal Medicine

## 2023-06-14 ENCOUNTER — Ambulatory Visit (HOSPITAL_BASED_OUTPATIENT_CLINIC_OR_DEPARTMENT_OTHER)
Admission: RE | Admit: 2023-06-14 | Discharge: 2023-06-14 | Disposition: A | Discharge status: Home / Self Care | Source: Ambulatory Visit | Attending: Internal Medicine | Admitting: Internal Medicine

## 2023-06-14 DIAGNOSIS — R0781 Pleurodynia: Secondary | ICD-10-CM | POA: Insufficient documentation

## 2023-06-25 ENCOUNTER — Encounter: Payer: Self-pay | Admitting: Internal Medicine

## 2023-07-14 ENCOUNTER — Ambulatory Visit (INDEPENDENT_AMBULATORY_CARE_PROVIDER_SITE_OTHER): Payer: Medicare PPO

## 2023-07-14 DIAGNOSIS — I441 Atrioventricular block, second degree: Secondary | ICD-10-CM | POA: Diagnosis not present

## 2023-07-15 ENCOUNTER — Encounter: Payer: Self-pay | Admitting: Internal Medicine

## 2023-07-15 LAB — CUP PACEART REMOTE DEVICE CHECK
Battery Voltage: 85
Date Time Interrogation Session: 20250528081047
Implantable Lead Connection Status: 753985
Implantable Lead Connection Status: 753985
Implantable Lead Implant Date: 20230301
Implantable Lead Implant Date: 20230301
Implantable Lead Location: 753859
Implantable Lead Location: 753860
Implantable Lead Model: 377171
Implantable Lead Model: 377171
Implantable Lead Serial Number: 8000691836
Implantable Lead Serial Number: 8000717006
Implantable Pulse Generator Implant Date: 20230301
Pulse Gen Model: 407145
Pulse Gen Serial Number: 70324575

## 2023-07-22 ENCOUNTER — Ambulatory Visit: Payer: Self-pay | Admitting: Internal Medicine

## 2023-07-23 NOTE — Telephone Encounter (Signed)
 Addressed in another message 07/22/23

## 2023-08-06 ENCOUNTER — Other Ambulatory Visit: Payer: Self-pay

## 2023-08-06 DIAGNOSIS — I480 Paroxysmal atrial fibrillation: Secondary | ICD-10-CM

## 2023-08-06 DIAGNOSIS — D6869 Other thrombophilia: Secondary | ICD-10-CM

## 2023-09-06 NOTE — Progress Notes (Signed)
 Remote pacemaker transmission.

## 2023-09-06 NOTE — Addendum Note (Signed)
 Addended by: VICCI SELLER A on: 09/06/2023 11:24 AM   Modules accepted: Orders

## 2023-10-06 ENCOUNTER — Encounter: Payer: Self-pay | Admitting: Internal Medicine

## 2023-10-13 ENCOUNTER — Ambulatory Visit (INDEPENDENT_AMBULATORY_CARE_PROVIDER_SITE_OTHER): Payer: Medicare PPO

## 2023-10-13 DIAGNOSIS — I441 Atrioventricular block, second degree: Secondary | ICD-10-CM | POA: Diagnosis not present

## 2023-10-14 LAB — CUP PACEART REMOTE DEVICE CHECK
Date Time Interrogation Session: 20250827105336
Implantable Lead Connection Status: 753985
Implantable Lead Connection Status: 753985
Implantable Lead Implant Date: 20230301
Implantable Lead Implant Date: 20230301
Implantable Lead Location: 753859
Implantable Lead Location: 753860
Implantable Lead Model: 377171
Implantable Lead Model: 377171
Implantable Lead Serial Number: 8000691836
Implantable Lead Serial Number: 8000717006
Implantable Pulse Generator Implant Date: 20230301
Pulse Gen Model: 407145
Pulse Gen Serial Number: 70324575

## 2023-10-18 ENCOUNTER — Ambulatory Visit: Payer: Self-pay | Admitting: Internal Medicine

## 2023-11-01 NOTE — Progress Notes (Signed)
 Remote PPM Transmission

## 2023-11-02 ENCOUNTER — Other Ambulatory Visit: Payer: Self-pay | Admitting: Internal Medicine

## 2023-11-02 NOTE — Telephone Encounter (Signed)
 OK refill albuterol  x one.  Last OV 11/02/2022.  Patient needs OV.

## 2023-12-16 ENCOUNTER — Encounter: Payer: Self-pay | Admitting: Internal Medicine

## 2023-12-21 NOTE — Telephone Encounter (Signed)
 Patient called- Dr. Marcine- stating he is trying to make an appt with Dr. Pincus. His provider, Dr. Layman referred him.  He says Dr. Layman has emailed Dr. Pincus concerning him.  Dr. Pincus, do you want the patient to have additional testing, as he is coming from [facility].

## 2023-12-22 ENCOUNTER — Encounter (HOSPITAL_COMMUNITY): Payer: Self-pay

## 2023-12-22 ENCOUNTER — Encounter (HOSPITAL_COMMUNITY): Payer: Self-pay | Admitting: Cardiovascular Disease

## 2023-12-22 ENCOUNTER — Emergency Department (HOSPITAL_COMMUNITY): Admission: EM | Admit: 2023-12-22 | Discharge: 2023-12-22 | Disposition: A | Discharge status: Home / Self Care

## 2023-12-22 ENCOUNTER — Other Ambulatory Visit: Payer: Self-pay | Admitting: Cardiology

## 2023-12-22 ENCOUNTER — Emergency Department (HOSPITAL_COMMUNITY)

## 2023-12-22 ENCOUNTER — Other Ambulatory Visit: Payer: Self-pay

## 2023-12-22 DIAGNOSIS — I10 Essential (primary) hypertension: Secondary | ICD-10-CM | POA: Diagnosis not present

## 2023-12-22 DIAGNOSIS — R079 Chest pain, unspecified: Secondary | ICD-10-CM

## 2023-12-22 DIAGNOSIS — J45909 Unspecified asthma, uncomplicated: Secondary | ICD-10-CM | POA: Diagnosis not present

## 2023-12-22 DIAGNOSIS — Z7901 Long term (current) use of anticoagulants: Secondary | ICD-10-CM | POA: Diagnosis not present

## 2023-12-22 DIAGNOSIS — Z79899 Other long term (current) drug therapy: Secondary | ICD-10-CM | POA: Diagnosis not present

## 2023-12-22 DIAGNOSIS — R072 Precordial pain: Secondary | ICD-10-CM | POA: Diagnosis present

## 2023-12-22 DIAGNOSIS — E785 Hyperlipidemia, unspecified: Secondary | ICD-10-CM

## 2023-12-22 DIAGNOSIS — I480 Paroxysmal atrial fibrillation: Secondary | ICD-10-CM

## 2023-12-22 LAB — CBC
HCT: 38.5 % — ABNORMAL LOW (ref 39.0–52.0)
Hemoglobin: 13.2 g/dL (ref 13.0–17.0)
MCH: 31.9 pg (ref 26.0–34.0)
MCHC: 34.3 g/dL (ref 30.0–36.0)
MCV: 93 fL (ref 80.0–100.0)
Platelets: 165 K/uL (ref 150–400)
RBC: 4.14 MIL/uL — ABNORMAL LOW (ref 4.22–5.81)
RDW: 12.3 % (ref 11.5–15.5)
WBC: 9.4 K/uL (ref 4.0–10.5)
nRBC: 0 % (ref 0.0–0.2)

## 2023-12-22 LAB — BASIC METABOLIC PANEL WITH GFR
Anion gap: 13 (ref 5–15)
BUN: 10 mg/dL (ref 8–23)
CO2: 22 mmol/L (ref 22–32)
Calcium: 8.5 mg/dL — ABNORMAL LOW (ref 8.9–10.3)
Chloride: 99 mmol/L (ref 98–111)
Creatinine, Ser: 0.66 mg/dL (ref 0.61–1.24)
GFR, Estimated: >60 mL/min (ref >60)
Glucose, Bld: 125 mg/dL — ABNORMAL HIGH (ref 70–99)
Potassium: 3.7 mmol/L (ref 3.5–5.1)
Sodium: 134 mmol/L — ABNORMAL LOW (ref 135–145)

## 2023-12-22 LAB — TROPONIN I (HIGH SENSITIVITY)
Troponin I (High Sensitivity): 20 ng/L — ABNORMAL HIGH (ref <18)
Troponin I (High Sensitivity): 18 ng/L — ABNORMAL HIGH (ref <18)

## 2023-12-22 MED ORDER — MORPHINE SULFATE (PF) 2 MG/ML IV SOLN
2.0000 mg | Freq: Once | INTRAVENOUS | Status: AC | PRN
  Administered 2023-12-22: 2 mg via INTRAVENOUS
  Filled 2023-12-22: qty 1

## 2023-12-22 MED ORDER — NITROGLYCERIN 0.4 MG SL SUBL
0.4000 mg | SUBLINGUAL_TABLET | SUBLINGUAL | Status: DC | PRN
  Administered 2023-12-22 (×2): 0.4 mg via SUBLINGUAL
  Filled 2023-12-22: qty 1

## 2023-12-22 MED ORDER — IPRATROPIUM-ALBUTEROL 0.5-2.5 (3) MG/3ML IN SOLN
3.0000 mL | Freq: Once | RESPIRATORY_TRACT | Status: AC
  Administered 2023-12-22: 3 mL via RESPIRATORY_TRACT
  Filled 2023-12-22: qty 3

## 2023-12-22 MED ORDER — ALUM & MAG HYDROXIDE-SIMETH 200-200-20 MG/5ML PO SUSP
30.0000 mL | Freq: Once | ORAL | Status: AC
  Administered 2023-12-22: 30 mL via ORAL
  Filled 2023-12-22: qty 30

## 2023-12-22 MED ORDER — MORPHINE SULFATE (PF) 2 MG/ML IV SOLN
2.0000 mg | Freq: Once | INTRAVENOUS | Status: DC
Start: 2023-12-22 — End: 2023-12-22
  Filled 2023-12-22: qty 1

## 2023-12-22 NOTE — ED Notes (Signed)
 Patient complaining of pain, MD notified awaiting orders.  Wife reports Cards PA came and spoke with them and that she was going to speak with Dr Wonda.

## 2023-12-22 NOTE — ED Provider Notes (Signed)
 [place] EMERGENCY DEPARTMENT AT [facility] Provider Note   CSN: [id number] Arrival date & time: 12/22/23  9356     Patient presents with: Chest Pain   Ralph Goodman is a 83 y.o. male.   This is a pleasant 83 year old male presenting emergency department for chest pain.  Reports substernal.  Awoke from sleep and had chest pain.  He did have pain radiating to his left shoulder, but attributes this to positioning in the bed.  Pain in shoulder is resolved.  Reports some mild improvement after receiving nitro/aspirin from EMS.  Endorsing some mild shortness of breath, reports reactive airway disease and feels similar.  Requesting oxygen or breathing treatment.  No symptoms of heart failure.  Low risk for PE based on Wells criteria   Chest Pain      Prior to Admission medications   Medication Sig Start Date End Date Taking? Authorizing Provider  acyclovir (ZOVIRAX) 400 MG tablet Take 400 mg by mouth as needed.    [provider]  albuterol  (VENTOLIN  HFA) 108 (90 Base) MCG/ACT inhaler INHALE 2 PUFFS EVERY 6 HOURS AS NEEDED- RESCUE 11/02/23   Neysa Rama D, MD  apixaban  (ELIQUIS ) 5 MG TABS tablet Take 1 tablet (5 mg total) by mouth 2 (two) times daily. 03/12/23   Cindie Ole DASEN, MD  atorvastatin (LIPITOR) 10 MG tablet Take 10 mg by mouth in the morning. 05/17/16   [provider]  Budeson-Glycopyrrol-Formoterol (BREZTRI  AEROSPHERE) 160-9-4.8 MCG/ACT AERO Inhale 2 puffs into the lungs in the morning and at bedtime. 11/02/22   Neysa Rama D, MD  celecoxib (CELEBREX) 100 MG capsule Take 100 mg by mouth as needed. 09/16/21   [provider]  diazepam (VALIUM) 10 MG tablet Take 10 mg by mouth as needed for sleep.    [provider]  eplerenone (INSPRA) 50 MG tablet Take 50 mg by mouth 2 (two) times daily.    [provider]  fluticasone  (FLONASE) 50 MCG/ACT nasal spray Place 1 spray into both nostrils as needed. 02/27/21   [provider]  hydrochlorothiazide (HYDRODIURIL) 25 MG tablet Take 25 mg by mouth daily.    [provider]  IBSRELA 50 MG TABS Take 50 mg by mouth 2 (two) times daily.    [provider]  ibuprofen (ADVIL) 200 MG tablet Take 600 mg by mouth as needed (pain).    [provider]  irbesartan (AVAPRO) 150 MG tablet Take 1 tablet by mouth 2 (two) times daily. 05/27/16   [provider]  magnesium  oxide (MAG-OX) 400 (240 Mg) MG tablet Take 1 tablet (400 mg total) by mouth daily. 04/12/21   Rai, Nydia POUR, MD  montelukast (SINGULAIR) 10 MG tablet Take 10 mg by mouth every evening. 08/26/13   [provider]  Multiple Vitamin (MULTIVITAMIN WITH MINERALS) TABS tablet Take 1 tablet by mouth in the morning.    [provider]  Multiple Vitamins-Minerals (PRESERVISION AREDS 2 PO) Take 1 tablet by mouth in the morning and at bedtime.    [provider]  nystatin-triamcinolone (MYCOLOG II) cream Apply topically as needed.    [provider]  pantoprazole (PROTONIX) 40 MG tablet Take 40 mg by mouth daily. As needed 04/14/16   [provider]  potassium chloride  SA (KLOR-CON  M) 20 MEQ tablet Take 20 mEq by mouth daily.    [provider]  Prucalopride Succinate (MOTEGRITY) 2 MG TABS Take 2 mg by mouth in the morning.  [provider]  zolpidem  (AMBIEN ) 5 MG tablet Take 5 mg by mouth at bedtime as needed for sleep. prn 01/29/21   [provider]    Allergies: Patient has no known allergies.    Review of Systems  Cardiovascular:  Positive for chest pain.    Updated Vital Signs BP 133/63   Pulse 70   Temp 98.8 F (37.1 C) (Oral)   Resp (!) 21   SpO2 100%   Physical Exam Vitals and nursing note reviewed.  Constitutional:      Appearance: He is obese.  HENT:     Head: Normocephalic and atraumatic.  Cardiovascular:     Rate and Rhythm: Normal rate and regular rhythm.     Pulses:           Radial pulses are 2+ on the right side and 2+ on the left side.       Dorsalis pedis pulses are 2+ on the right side and 2+ on the left side.  Pulmonary:     Effort: Pulmonary effort is normal. No respiratory distress.     Breath sounds: Normal breath sounds. No wheezing, rhonchi or rales.  Musculoskeletal:     Right lower leg: Edema present.     Left lower leg: Edema present.  Skin:    General: Skin is warm and dry.     Capillary Refill: Capillary refill takes less than 2 seconds.  Neurological:     Mental Status: He is alert and oriented to person, place, and time.  Psychiatric:        Mood and Affect: Mood normal.        Behavior: Behavior normal.     (all labs ordered are listed, but only abnormal results are displayed) Labs Reviewed  BASIC METABOLIC PANEL WITH GFR - Abnormal; Notable for the following components:      Result Value   Sodium 134 (*)    Glucose, Bld 125 (*)    Calcium 8.5 (*)    All other components within normal limits  CBC - Abnormal; Notable for the following components:   RBC 4.14 (*)    HCT 38.5 (*)    All other components within normal limits  TROPONIN I (HIGH SENSITIVITY) - Abnormal; Notable for the following components:   Troponin I (High Sensitivity) 20 (*)    All other components within normal limits  TROPONIN I (HIGH SENSITIVITY) - Abnormal; Notable for the following components:   Troponin I (High Sensitivity) 18 (*)    All other components within normal limits    EKG: EKG Interpretation Date/Time:  Wednesday December 22 2023 11:15:44 EST Ventricular Rate:  64 PR Interval:  210 QRS Duration:  143 QT Interval:  463 QTC Calculation: 478 R Axis:   66  Text Interpretation: Sinus rhythm Atrial premature complex Nonspecific intraventricular conduction delay Probable anteroseptal infarct, old Confirmed by Neysa Clap [phone]) on 12/22/2023 11:58:24 AM  Radiology: DG Chest 2 View Result Date: 12/22/2023 EXAM: 2 VIEW(S) XRAY OF THE CHEST  12/22/2023 07:26:00 AM COMPARISON: Chest radiograph comparisons 04/16/2023 and earlier. CLINICAL HISTORY: 83 year old male. Chest pain. FINDINGS: LUNGS AND PLEURA: Blunting of the costophrenic angles appears to be chronic. No acute pulmonary opacity. No pulmonary edema. No pleural effusion. No pneumothorax. HEART AND MEDIASTINUM: Stable left chest cardiac pacemaker. Stable cardiomegaly and mediastinal contours. BONES AND SOFT TISSUES: Osteopenia. Stable visible osseous structures. No acute osseous abnormality. Negative visible bowel gas. IMPRESSION: 1. No acute cardiopulmonary abnormality. Electronically signed by: Helayne  Hall MD 12/22/2023 07:36 AM EST RP Workstation: [id number]     Procedures   Medications Ordered in the ED  nitroGLYCERIN (NITROSTAT) SL tablet 0.4 mg (0.4 mg Sublingual Given 12/22/23 0915)  morphine (PF) 2 MG/ML injection 2 mg (2 mg Intravenous Not Given 12/22/23 1326)  ipratropium-albuterol  (DUONEB) 0.5-2.5 (3) MG/3ML nebulizer solution 3 mL (3 mLs Nebulization Given 12/22/23 0813)  alum & mag hydroxide-simeth (MAALOX/MYLANTA) 200-200-20 MG/5ML suspension 30 mL (30 mLs Oral Given 12/22/23 0813)  morphine (PF) 2 MG/ML injection 2 mg (2 mg Intravenous Given 12/22/23 0920)  morphine (PF) 2 MG/ML injection 2 mg (2 mg Intravenous Given 12/22/23 1117)    Clinical Course as of 12/22/23 1551  Wed Dec 22, 2023  0702 Echo in 2023 per my chart review: 1. Left ventricular ejection fraction, by estimation, is 60 to 65%. The  left ventricle has normal function. The left ventricle has no regional  wall motion abnormalities. The left ventricular internal cavity size was  moderately dilated. Left ventricular  diastolic parameters are indeterminate   [TY]  0703 Cardiology note 10/18/23: Remote pacemaker interrogation. Presenting Rhythm:A-sensed V-paced. Battery and lead parameters stable with stable capture and sensing. Device programming is appropriate. Continue remote monitoring.  [TY]  0727  CBC(!) No leukocytosis to suggest systemic infection.  No anemia [TY]  0750 Troponin I (High Sensitivity)(!): 20 Mild elevation [TY]  0750 DG Chest 2 View IMPRESSION: 1. No acute cardiopulmonary abnormality.   [TY]  0750 Basic metabolic panel(!) Stable compared to prior.  [TY]  [id number] Spoke with cardiology to evaluate patient given his ongoing chest pain and elevated troponin. They will evaluate patient.  [TY]  1105 Spoke with cardiology again, they have many consults and are working to see patient as fast as possible. [TY]  1341 Evaluated by cardiology.  Recommending outpatient workup.  Will discharge in stable condition [TY]    Clinical Course User Index [TY] Neysa Caron PARAS, DO                                 Medical Decision Making Is a pleasant 83 year old male presenting emergency department for chest pain.  He is afebrile nontachycardic, slightly hypertensive.  Maintaining oxygen saturation on room air.  Complicated past medical history to include a history of heart block status post pacemaker history of A-fib not on Eliquis , hypertension, venous insufficiency, reactive airway disease.  Clinically well-appearing, does not appear to be in distress.  EMS reported giving nitro and aspirin prior to arrival.  His EKG today by independent interpretation is similar to prior and without overt ischemic changes.  His initial troponin is mildly elevated at 20.  No leukocytosis to suggest systemic infection.  No metabolic derangements.  Normal kidney function.  Will interrogate his pacemaker.  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  Will trial GI cocktail in case there is a reflux component as he is continuing to have symptoms.  He is mildly shortness of breath, but lungs are largely clear.  Will give DuoNeb.  Plan for delta troponin and likely cardiology consult.  See ED course for further MDM and final disposition.  Amount and/or Complexity of Data  Reviewed External Data Reviewed:     Details: See ED course.  Labs: ordered. Decision-making details documented in ED Course. Radiology: ordered and independent interpretation performed. Decision-making details documented in ED Course.    Details: No  significant change from prior.  No acute abnormality. ECG/medicine tests: ordered and independent interpretation performed.  Risk OTC drugs. Prescription drug management.       Final diagnoses:  Chest pain, unspecified type    ED Discharge Orders     None          Neysa Caron PARAS, DO 12/22/23 1551

## 2023-12-22 NOTE — Consult Note (Addendum)
 Cardiology Consultation  Patient ID: Ralph Goodman MRN: [id number]; DOB: 1940/12/26  Admit date: 12/22/2023 Date of Consult: 12/22/2023  PCP:  Layman Debby BROCKS, MD   [facility] Providers Cardiologist:  None  Electrophysiologist:  OLE ONEIDA HOLTS, MD    Patient Profile: Ralph Goodman is a 83 y.o. male with a hx of paroxysmal A-Fib, history of heart block s/p PPM, hypertension, hyponatremia who is being seen 12/22/2023 for the evaluation of chest pain at the request of Dr. Neysa.  History of Present Illness: Ralph Goodman has past medical history as stated above. He presented to [facility] ED on 12/22/2023 via EMS for chest pain that he reports awoke him from sleep. He reported associated radiation to his left shoulder. When EMS arrived they gave him ASA 324 mg as well as SL NTG x 1 dose.   Relevant workup while in the ED includes: EKG without acute ischemic changes, troponin 20 ? 18, CBC and BMP unremarkable showing just mild chronic hyponatremia. CXR no acute abnormalities.   After speaking with the patient, he tells me that he typically suffers with IBS-C and deals with abdominal distention/bloating fairly often.  However the pain that awoke him during this night was different than his typical abdominal pain.  He notes that this was substernal chest pain.  He notes that he received around 4 doses of sublingual nitroglycerin which did not relieve his pain by much.  He has received some doses of morphine, this seems to be blunting the pain.  He denies any alcohol, tobacco abuse.  He tells me that he is compliant with his statin as well as his hypertensive meds.  He denies any family history of any ischemic heart disease.  In the past he had attempted to undergo a stress test, however this was unable to be completed as he was unable to tolerate the treadmill.  He tells me that he typically has his cardiology care he was previously seen by Dr. Waddell as well as our A-fib  clinic.  He was then followed briefly by Dr. Holts with plans for potential Watchman device however this was never followed up with.  He also follows closely with [facility] cardiology primarily for his hypertensive care.  His primary care is also through [facility].  He was last seen by his primary care July 2025.  They noted at this time because he has had a single episode of atrial fibrillation noted in 2024, he self discontinued the Eliquis . Discussed various options including inpatient LHC vs outpatient stress, will discuss with MD.  Past Medical History:  Diagnosis Date   Heart block    HTN (hypertension)    OSA on CPAP    PVC (premature ventricular contraction)    Venous insufficiency    Past Surgical History:  Procedure Laterality Date   PACEMAKER IMPLANT N/A 04/16/2021   Procedure: PACEMAKER IMPLANT;  Surgeon: Waddell Danelle ORN, MD;  Location: [facility] INVASIVE CV LAB;  Service: Cardiovascular;  Laterality: N/A;    Home Medications:  Prior to Admission medications   Medication Sig Start Date End Date Taking? Authorizing Provider  acyclovir (ZOVIRAX) 400 MG tablet Take 400 mg by mouth as needed.    [provider]  albuterol  (VENTOLIN  HFA) 108 (90 Base) MCG/ACT inhaler INHALE 2 PUFFS EVERY 6 HOURS AS NEEDED- RESCUE 11/02/23   Neysa Rama D, MD  apixaban  (ELIQUIS ) 5 MG TABS tablet Take 1 tablet (5 mg total) by mouth 2 (two) times daily. 03/12/23   Holts,  Ole DASEN, MD  atorvastatin (LIPITOR) 10 MG tablet Take 10 mg by mouth in the morning. 05/17/16   [provider]  Budeson-Glycopyrrol-Formoterol (BREZTRI  AEROSPHERE) 160-9-4.8 MCG/ACT AERO Inhale 2 puffs into the lungs in the morning and at bedtime. 11/02/22   Neysa Rama D, MD  celecoxib (CELEBREX) 100 MG capsule Take 100 mg by mouth as needed. 09/16/21   [provider]  diazepam (VALIUM) 10 MG tablet Take 10 mg by mouth as needed for sleep.    [provider]  eplerenone (INSPRA) 50 MG tablet Take 50 mg by mouth 2  (two) times daily.    [provider]  fluticasone  (FLONASE) 50 MCG/ACT nasal spray Place 1 spray into both nostrils as needed. 02/27/21   [provider]  hydrochlorothiazide (HYDRODIURIL) 25 MG tablet Take 25 mg by mouth daily.    [provider]  IBSRELA 50 MG TABS Take 50 mg by mouth 2 (two) times daily.    [provider]  ibuprofen (ADVIL) 200 MG tablet Take 600 mg by mouth as needed (pain).    [provider]  irbesartan (AVAPRO) 150 MG tablet Take 1 tablet by mouth 2 (two) times daily. 05/27/16   [provider]  magnesium  oxide (MAG-OX) 400 (240 Mg) MG tablet Take 1 tablet (400 mg total) by mouth daily. 04/12/21   Rai, Nydia POUR, MD  montelukast (SINGULAIR) 10 MG tablet Take 10 mg by mouth every evening. 08/26/13   [provider]  Multiple Vitamin (MULTIVITAMIN WITH MINERALS) TABS tablet Take 1 tablet by mouth in the morning.    [provider]  Multiple Vitamins-Minerals (PRESERVISION AREDS 2 PO) Take 1 tablet by mouth in the morning and at bedtime.    [provider]  nystatin-triamcinolone (MYCOLOG II) cream Apply topically as needed.    [provider]  pantoprazole (PROTONIX) 40 MG tablet Take 40 mg by mouth daily. As needed 04/14/16   [provider]  potassium chloride  SA (KLOR-CON  M) 20 MEQ tablet Take 20 mEq by mouth daily.    [provider]  Prucalopride Succinate (MOTEGRITY) 2 MG TABS Take 2 mg by mouth in the morning.    [provider]  zolpidem  (AMBIEN ) 5 MG tablet Take 5 mg by mouth at bedtime as needed for sleep. prn 01/29/21   [provider]   Scheduled Meds:   morphine injection  2 mg Intravenous Once   Continuous Infusions:  PRN Meds: nitroGLYCERIN  Allergies:   No Known Allergies  Social History:   Social History   Socioeconomic History   Marital status: Married    Spouse name: Not on file   Number of children: Not on file   Years  of education: Not on file   Highest education level: Not on file  Occupational History   Not on file  Tobacco Use   Smoking status: Never   Smokeless tobacco: Never  Vaping Use   Vaping status: Never Used  Substance and Sexual Activity   Alcohol use: Yes    Alcohol/week: 5.0 standard drinks of alcohol    Types: 5 Glasses of wine per week    Comment: occ   Drug use: Never   Sexual activity: Not on file  Other Topics Concern   Not on file  Social History Narrative   Not on file   Social Drivers of Health   Financial Resource Strain: Low Risk  (07/31/2022)   Received from [facility]   Overall Financial Resource  Strain (CARDIA)    Difficulty of Paying Living Expenses: Not hard at all  Food Insecurity: No Food Insecurity (07/31/2022)   Received from [facility]   Hunger Vital Sign    Within the past 12 months, you worried that your food would run out before you got the money to buy more.: Never true    Within the past 12 months, the food you bought just didn't last and you didn't have money to get more.: Never true  Transportation Needs: No Transportation Needs (07/31/2022)   Received from [facility]   PRAPARE - Transportation    Lack of Transportation (Medical): No    Lack of Transportation (Non-Medical): No  Physical Activity: Not on file  Stress: Not on file  Social Connections: Not on file  Intimate Partner Violence: Not At Risk (07/31/2022)   Received from [facility]   Humiliation, Afraid, Rape, and Kick questionnaire    Within the last year, have you been afraid of your partner or ex-partner?: No    Within the last year, have you been humiliated or emotionally abused in other ways by your partner or ex-partner?: No    Within the last year, have you been kicked, hit, slapped, or otherwise physically hurt by your partner or ex-partner?: No    Within the last year, have you been raped or forced to have any kind of sexual activity by your partner or  ex-partner?: No    Family History:   History reviewed. No pertinent family history.   ROS:  Please see the history of present illness.  All other ROS reviewed and negative.     Physical Exam/Data: Vitals:   12/22/23 1115 12/22/23 1200 12/22/23 1245 12/22/23 1300  BP: (!) 147/84 (!) 151/78 (!) 155/86 (!) 152/77  Pulse: 64 73 74 70  Resp: 19 14 18  (!) 24  Temp:      TempSrc:      SpO2: 100% 100% 97% 96%   No intake or output data in the 24 hours ending 12/22/23 1359    04/01/2023    3:51 PM 01/05/2023   11:15 PM 12/09/2022    3:35 PM  Last 3 Weights  Weight (lbs) 236 lb 6.4 oz 238 lb 1.6 oz 238 lb 3.2 oz  Weight (kg) 107.23 kg 108 kg 108.047 kg     There is no height or weight on file to calculate BMI.   General:  in no acute distress HEENT: normal Neck: no JVD Vascular: No carotid bruits; Distal pulses 2+ bilaterally Cardiac:  normal S1, S2; RRR; no murmur  Lungs:  clear to auscultation bilaterally Abd: soft, nontender, no hepatomegaly  Ext: no edema Musculoskeletal:  No deformities Skin: warm and dry  Neuro:  no focal abnormalities noted Psych:  Normal affect   EKG:  The EKG was personally reviewed and demonstrates:  sinus rhythm, HR 64, PAC  Telemetry:  Telemetry was personally reviewed and demonstrates:  sinus rhythm, frequent ectopy, HR 60-70s  Relevant CV Studies:  Echocardiogram, 04/12/2021 Left ventricular ejection fraction, by estimation, is 60 to 65% . The left ventricle has normal function. The left ventricle has no regional wall motion abnormalities. The left ventricular internal cavity size was moderately dilated. Left ventricular diastolic parameters are indeterminate.  Right ventricular systolic function is moderately reduced. The right ventricular size is normal. Mildly increased right ventricular wall thickness. Tricuspid regurgitation signal is inadequate for assessing PA pressure.  Left atrial size was severely dilated.  The mitral valve  is normal  in structure. Trivial mitral valve regurgitation. No evidence of mitral stenosis.  The aortic valve is tricuspid. Aortic valve regurgitation is not visualized. Aortic valve sclerosis is present, with no evidence of aortic valve stenosis.  Aortic dilatation noted. There is mild dilatation of the ascending aorta, measuring 40 mm.  The inferior vena cava is normal in size with greater than 50% respiratory variability, suggesting right atrial pressure of 3 mmHg.  Laboratory Data: High Sensitivity Troponin:   Recent Labs  Lab 12/22/23 0700 12/22/23 0853  TROPONINIHS 20* 18*     Chemistry Recent Labs  Lab 12/22/23 0700  NA 134*  K 3.7  CL 99  CO2 22  GLUCOSE 125*  BUN 10  CREATININE 0.66  CALCIUM 8.5*  GFRNONAA >60  ANIONGAP 13    No results for input(s): PROT, ALBUMIN, AST, ALT, ALKPHOS, BILITOT in the last 168 hours. Lipids No results for input(s): CHOL, TRIG, HDL, LABVLDL, LDLCALC, CHOLHDL in the last 168 hours.  Hematology Recent Labs  Lab 12/22/23 0700  WBC 9.4  RBC 4.14*  HGB 13.2  HCT 38.5*  MCV 93.0  MCH 31.9  MCHC 34.3  RDW 12.3  PLT 165   Thyroid No results for input(s): TSH, FREET4 in the last 168 hours.  BNPNo results for input(s): BNP, PROBNP in the last 168 hours.  DDimer No results for input(s): DDIMER in the last 168 hours.  Radiology/Studies:  DG Chest 2 View Result Date: 12/22/2023 EXAM: 2 VIEW(S) XRAY OF THE CHEST 12/22/2023 07:26:00 AM COMPARISON: Chest radiograph comparisons 04/16/2023 and earlier. CLINICAL HISTORY: 83 year old male. Chest pain. FINDINGS: LUNGS AND PLEURA: Blunting of the costophrenic angles appears to be chronic. No acute pulmonary opacity. No pulmonary edema. No pleural effusion. No pneumothorax. HEART AND MEDIASTINUM: Stable left chest cardiac pacemaker. Stable cardiomegaly and mediastinal contours. BONES AND SOFT TISSUES: Osteopenia. Stable visible osseous structures. No acute osseous abnormality.  Negative visible bowel gas. IMPRESSION: 1. No acute cardiopulmonary abnormality. Electronically signed by: Helayne Hurst MD 12/22/2023 07:36 AM EST RP Workstation: [id number]   Assessment and Plan:  Chest pain  Reports episode of chest pain that occurred overnight EKG without acute ischemic changes  Troponin 20 ? 18  No past history of ischemic evaluations  Reports SL NTG x 3 doses did not resolve pain Reports resolution of pain with morphine  Discussed various options of evaluation in regards of chest pain, patient would prefer to defer an ischemic evaluation but considering he has not had any prior ischemic evaluation done with his symptoms may warrant LHC, will discuss with MD  After discussion, plan for outpatient Lexiscan Myoview   Informed Consent   Shared Decision Making/Informed Consent The risks [chest pain, shortness of breath, cardiac arrhythmias, dizziness, blood pressure fluctuations, myocardial infarction, stroke/transient ischemic attack, nausea, vomiting, allergic reaction, radiation exposure, metallic taste sensation and life-threatening complications (estimated to be 1 in 10,000)], benefits (risk stratification, diagnosing coronary artery disease, treatment guidance) and alternatives of a nuclear stress test were discussed in detail with Ralph Goodman and he agrees to proceed.     Hypertension Home meds: irbesartan 150 mg BID per last [facility] note BP was noted to be 129/63 at last PCP appointment   Paroxysmal A-Fib  Device interrogation showed no A-Fib burden Patient reports remote episode in 2024  Self-discontinued Eliquis    History of Mobtiz type 2 S/p PPM  Device interrogation shows no significant arrhthymias  Follows closely with [facility] for device interrogation  Hyperlipidemia LDL in 03/2023  was 47 Continue home Lipitor 10 mg daily  Risk Assessment/Risk Scores:      CHA2DS2-VASc Score = 3   This indicates a 3.2% annual risk of stroke. The patient's score is based  upon: CHF History: 0 HTN History: 1 Diabetes History: 0 Stroke History: 0 Vascular Disease History: 0 Age Score: 2 Gender Score: 0        For questions or updates, please contact [facility] Please consult [email or web address] for contact info under      Signed, Waddell DELENA Donath, PA-C  12/22/2023 1:59 PM  Patient seen, examined. Available data reviewed. Agree with findings, assessment, and plan as outlined by Waddell Donath, PA-C.  The patient is independently interviewed and examined.  He is a very pleasant, obese elderly gentleman in no distress.  HEENT is normal, JVP is difficult to visualize but appears to be normal, lungs are clear bilaterally, heart is regular rate and rhythm no murmur gallop, abdomen is soft, obese, and nontender, extremities have trace edema in the pretibial regions bilaterally with chronic stasis changes of the lower legs.  EKG is reviewed and shows an atrial sensed ventricular paced rhythm.  High-sensitivity troponin is minimally abnormal at 20 and 18.  The patient's symptoms are fairly atypical and he has chronic IBS and abdominal bloating/distention.  Initially he had some symptoms in the left chest and left shoulder which concerned him for heart pain.  Is symptoms and now moved into the epigastric area and have been relieved by passing gas.  He states what he is now experiencing is more like his chronic IBS pain.  His objective markers do not suggest any significant ischemia and he is very well-appearing.  Overall, I have a low suspicion for ACS.  I do think it would be reasonable to pursue an outpatient Lexiscan Myoview stress test to make sure that he does not have significant ischemia, but I suspect his symptoms are GI-related.  ER return precautions are given.  No medication changes are made today.  The patient is stable from a cardiac perspective for discharge from the emergency department.  Otherwise, as outlined above.  Ozell Fell,  M.D. 12/22/2023 2:25 PM

## 2023-12-22 NOTE — ED Triage Notes (Signed)
 Pt BIB [facility] from home with substernal chest pressure x 5 hours. Pt states taht he was sleeping and woke up and noticed pressure. Pain did radiate to left shoulder, but that pain has subsided and he only has the substernal pressure now. Pt does have a pacemaker. He reports that he is more SOB than normal.

## 2023-12-22 NOTE — ED Notes (Signed)
 Dr. Wonda at bedside with pt/wife.

## 2023-12-22 NOTE — ED Notes (Signed)
 Pt verbalizes understanding of DC instructions. Pt belongings returned and is assisted in [facility] out of E with wife

## 2023-12-22 NOTE — Discharge Instructions (Signed)
 Up with your primary doctor and your cardiologist.  Return for fevers, chills, lightheadedness, passout, worsening chest pain, shortness of breath, palpitations, severe pain or any new or worsening symptoms that are concerning to you.

## 2023-12-22 NOTE — Progress Notes (Signed)
 Ordering Lexiscan Myoview to be completed as outpatient

## 2024-01-12 ENCOUNTER — Ambulatory Visit: Payer: Medicare PPO

## 2024-01-12 DIAGNOSIS — I441 Atrioventricular block, second degree: Secondary | ICD-10-CM

## 2024-01-14 LAB — CUP PACEART REMOTE DEVICE CHECK
Date Time Interrogation Session: 20251126084923
Implantable Lead Connection Status: 753985
Implantable Lead Connection Status: 753985
Implantable Lead Implant Date: 20230301
Implantable Lead Implant Date: 20230301
Implantable Lead Location: 753859
Implantable Lead Location: 753860
Implantable Lead Model: 377171
Implantable Lead Model: 377171
Implantable Lead Serial Number: 8000691836
Implantable Lead Serial Number: 8000717006
Implantable Pulse Generator Implant Date: 20230301
Pulse Gen Model: 407145
Pulse Gen Serial Number: 70324575

## 2024-01-17 NOTE — Progress Notes (Signed)
 Remote PPM Transmission

## 2024-01-18 ENCOUNTER — Telehealth (HOSPITAL_COMMUNITY): Payer: Self-pay | Admitting: Cardiovascular Disease

## 2024-01-18 NOTE — Telephone Encounter (Signed)
 Patient cancelled Myoview and does not wish to have, see below:  01/18/2024 1:19 PM By: DUNN, OLIVIA B  Cancel Rsn: Patient (Patient does not want to have test and wanted to cancel   Order will be removed from the [facility]. Thank you.

## 2024-01-18 NOTE — Telephone Encounter (Signed)
OK - thanks for letting me know

## 2024-01-20 ENCOUNTER — Ambulatory Visit: Payer: Self-pay | Admitting: Internal Medicine

## 2024-01-24 ENCOUNTER — Ambulatory Visit (HOSPITAL_COMMUNITY)

## 2024-01-25 ENCOUNTER — Encounter: Payer: Self-pay | Admitting: Internal Medicine

## 2024-01-25 ENCOUNTER — Ambulatory Visit: Admitting: Internal Medicine

## 2024-01-25 VITALS — BP 138/64 | HR 76 | Temp 97.6°F | Ht 71.0 in | Wt 238.0 lb

## 2024-01-25 DIAGNOSIS — G4733 Obstructive sleep apnea (adult) (pediatric): Secondary | ICD-10-CM | POA: Diagnosis not present

## 2024-01-25 DIAGNOSIS — J45909 Unspecified asthma, uncomplicated: Secondary | ICD-10-CM | POA: Diagnosis not present

## 2024-01-25 MED ORDER — BREZTRI AEROSPHERE 160-9-4.8 MCG/ACT IN AERO: 2.0000 | INHALATION_SPRAY | Freq: Two times a day (BID) | RESPIRATORY_TRACT | 4 refills | Status: AC

## 2024-01-25 NOTE — Progress Notes (Signed)
 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  He feels he is doing quite well with CPAP. Breathing is currently well-controlled using albuterol  twice daily and Breztri  twice daily.  Medications discussed.   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 Discussed the use of AI scribe software for clinical note transcription with the patient, who gave verbal consent to proceed.  History of Present Illness   Ralph Goodman is an 83 year old male who presents for a follow-up on obstructive sleep apnea. .He has an ongoing w/u for GI issues.  He uses CPAP for sleep apnea with good control, reporting less than one breakthrough event per hour and good adherence without discomfort. He asks abouts considering Inspire in the future but is currently satisfied with CPAP. He has asthma treated with Breztri  and albuterol . He needs a refill for Breztri , and his  albuterol  was recently refilled.     CXR 12/22/23 IMPRESSION: 1. No acute cardiopulmonary abnormality.  Assessment and Plan:    Obstructive sleep apnea Well-controlled with CPAP therapy, less than one event per hour. Satisfied with CPAP, not interested in Proberta therapy due to satisfaction and its primitiveness. - Continue CPAP therapy. - Discuss Inspire therapy with local provider if interested in future.  Asthma- moderate persistent uncomplicated Managed with Breztri  and albuterol . Breztri  requires refill. - Refilled Breztri  prescription.        ROS-see HPI   + = positive Constitutional:    +weight loss, night sweats, fevers, chills, fatigue, lassitude. HEENT:    headaches, difficulty swallowing, tooth/dental problems, sore throat,       sneezing, itching, ear ache, nasal congestion, post nasal drip, snoring CV:    chest pain, orthopnea, PND, +swelling in lower extremities, anasarca,                                                         dizziness, + palpitations Resp:   + shortness of breath with exertion or at rest.                 productive cough,  + non-productive cough, coughing up of blood.              change in color of mucus.  wheezing.   Skin:    rash or lesions. GI:  No-   heartburn, indigestion, abdominal pain, nausea, vomiting, diarrhea,                 change  in bowel habits, loss of appetite GU: dysuria, change in color of urine, no urgency or frequency.   flank pain. MS:   joint pain, stiffness, decreased range of motion, back pain. Neuro-     nothing unusual Psych:  change in mood or affect.  depression or anxiety.   memory loss.  OBJ- Physical Exam General- Alert, Oriented, Affect-appropriate, Distress- none acute,  + morbidly obese Skin-  Lymphadenopathy- none Head- atraumatic            Eyes- Gross vision intact, PERRLA, conjunctivae and secretions clear            Ears- Hearing, canals-normal            Nose- ++ rhinophyma            Throat-  Mallampati IV , mucosa clear , drainage- none, tonsils- atrophic Neck- flexible , trachea midline, no stridor , thyroid nl, carotid no bruit Chest - symmetrical excursion , unlabored           Heart/CV- RRR/ pacemaker , no murmur , no gallop  , no rub, nl s1 s2                           - JVD- none , edema-none, stasis changes+, varices- none           Lung-  clear to P&A, wheeze- none, cough- none , dullness-none, rub- none           Chest wall- +L pacemaker Abd-  Br/ Gen/ Rectal- Not done, not indicated Extrem- +stasis changes legs Neuro- grossly intact to observation

## 2024-01-25 NOTE — Patient Instructions (Addendum)
 Glad you are doing well with CPAP. We can continue auto 8-12, mask of choice, humidifier, supplies, AirView/ card  Breztri  refill sent  Please call if we can help  I suggested Dr Jude at [facility]  would be a good choice future sleep doctor for you.

## 2024-01-26 ENCOUNTER — Encounter (HOSPITAL_COMMUNITY)

## 2024-01-27 ENCOUNTER — Telehealth: Payer: Self-pay

## 2024-01-27 ENCOUNTER — Ambulatory Visit: Attending: Internal Medicine

## 2024-01-27 DIAGNOSIS — I441 Atrioventricular block, second degree: Secondary | ICD-10-CM

## 2024-01-27 LAB — CUP PACEART INCLINIC DEVICE CHECK
Date Time Interrogation Session: 20251211150328
Implantable Lead Connection Status: 753985
Implantable Lead Connection Status: 753985
Implantable Lead Implant Date: 20230301
Implantable Lead Implant Date: 20230301
Implantable Lead Location: 753859
Implantable Lead Location: 753860
Implantable Lead Model: 377171
Implantable Lead Model: 377171
Implantable Lead Serial Number: 8000691836
Implantable Lead Serial Number: 8000717006
Implantable Pulse Generator Implant Date: 20230301
Pulse Gen Model: 407145
Pulse Gen Serial Number: 70324575

## 2024-01-27 NOTE — Progress Notes (Signed)
 Patient brought in acutely to assess presenting rhythm and AF burden. Full device check not complete. Presenting Rhythm AS/VS 82 w/ PVC's. AT/AF burden 3%. Not on OAC at this time according to patient. See attachment in PaceArt for details.  See phone note 01/27/2024.

## 2024-01-27 NOTE — Patient Instructions (Signed)
 Follow up as scheduled.

## 2024-01-27 NOTE — Telephone Encounter (Addendum)
 B[employer]onik alert received for increase in AF burden X 8 days. No EGM available in Biotronik.  Patient denies any symptoms and feeling overall well. Patient stopped Eliquis  3 months ago on his own. States he has some at home if Dr. Waddell would like for patient to resume. Patient was previously seen by Dr. Cindie for [facility] but decided not to proceed considering most recent AF was back in 2023 per pt.   Advised pt I will send message to Dr. Waddell to advise further. Patient is leaving for out of town 01/31/24 for 3 weeks in [place].   Patient added onto device schedule 01/27/24 @ 2:40 PM to check presenting rhythm to confirm if in AF. Time and location provided to patient w/ verbal understanding.

## 2024-01-27 NOTE — Telephone Encounter (Signed)
 Patient brought in acutely to assess presenting rhythm and AF burden. Full device check not complete. Presenting Rhythm AS/VS 82 (SR) w/ PVC's. AT/AF burden 3%. Not on OAC at this time according to patient. See attachment in PaceArt for details.

## 2024-01-30 NOTE — Telephone Encounter (Signed)
 He has refused an OAC in the past despite my objections. I absolutely want him on an OAC.

## 2024-02-04 ENCOUNTER — Encounter: Payer: Self-pay | Admitting: Internal Medicine

## 2024-02-24 NOTE — Telephone Encounter (Signed)
 Attempted to call patient. Unable to leave voicemail.

## 2024-02-28 NOTE — Telephone Encounter (Signed)
 Left message for patient to call back

## 2024-02-28 NOTE — Telephone Encounter (Signed)
 Biotronik Pacemaker transmission update:   Patient continues to have high incidence of Afib since end of November.  Please follow up with patient for symptoms and to ensure he is taking his Eliquis  per below orders from Dr. Waddell.

## 2024-03-06 NOTE — Telephone Encounter (Signed)
 Attempted phone call to pt and left voicemail message to contact office at [phone].  Will also send MyCHart message to pt for further advisement.

## 2024-03-20 ENCOUNTER — Telehealth: Payer: Self-pay

## 2024-03-20 NOTE — Telephone Encounter (Signed)
 [employer] PPM alert: AF burden  Patient admitted to [facility] 03/09/24. Was seen by EP there with DCCV on 03/10/24 and plans for a BIV upgrade in 4 weeks. need to follow up with patient to determine who is following and what plans are at present for device and device/rhythm management.

## 2024-03-22 NOTE — Telephone Encounter (Signed)
 Per review of recent discharge summary Pt has appointment to establish care with:  Mar 29, 2024 2:40 PM (Arrive by 2:25 PM) NEW CARDIOLOGY with Genevie Southgate, MD [facility] CARDIOLOGY [place] [facility] REGION) [address] [facility] [address] [place] [id number] [phone]    Will await request to establish remote monitoring after appointment scheduled for February 11.

## 2024-04-12 ENCOUNTER — Ambulatory Visit

## 2024-07-12 ENCOUNTER — Ambulatory Visit

## 2024-10-11 ENCOUNTER — Ambulatory Visit

## 2025-01-10 ENCOUNTER — Ambulatory Visit
